# Patient Record
Sex: Female | Born: 1985 | Race: White | Hispanic: No | Marital: Single | Smoking: Never smoker
Health system: Southern US, Community
[De-identification: ages and names within clinical notes are randomized; demographics above are authoritative.]

## PROBLEM LIST (undated history)

## (undated) DIAGNOSIS — R569 Unspecified convulsions: Secondary | ICD-10-CM

## (undated) HISTORY — PX: OTHER SURGICAL HISTORY: SHX169

## (undated) HISTORY — PX: ADENOIDECTOMY: SUR15

## (undated) HISTORY — PX: TONSILLECTOMY: SUR1361

## (undated) HISTORY — PX: CHOLECYSTECTOMY: SHX55

---

## 2005-07-26 ENCOUNTER — Emergency Department (HOSPITAL_COMMUNITY): Admission: EM | Admit: 2005-07-26 | Discharge: 2005-07-26 | Payer: Self-pay | Admitting: Emergency Medicine

## 2006-09-09 ENCOUNTER — Emergency Department (HOSPITAL_COMMUNITY): Admission: EM | Admit: 2006-09-09 | Discharge: 2006-09-09 | Payer: Self-pay | Admitting: Emergency Medicine

## 2006-11-23 ENCOUNTER — Emergency Department (HOSPITAL_COMMUNITY): Admission: EM | Admit: 2006-11-23 | Discharge: 2006-11-23 | Payer: Self-pay | Admitting: Family Medicine

## 2007-05-26 ENCOUNTER — Emergency Department (HOSPITAL_COMMUNITY): Admission: EM | Admit: 2007-05-26 | Discharge: 2007-05-26 | Payer: Self-pay | Admitting: Emergency Medicine

## 2007-11-16 ENCOUNTER — Emergency Department (HOSPITAL_COMMUNITY): Admission: EM | Admit: 2007-11-16 | Discharge: 2007-11-16 | Payer: Self-pay | Admitting: Family Medicine

## 2008-12-05 ENCOUNTER — Ambulatory Visit (HOSPITAL_COMMUNITY): Admission: RE | Admit: 2008-12-05 | Discharge: 2008-12-05 | Payer: Self-pay | Admitting: Family Medicine

## 2009-10-02 ENCOUNTER — Ambulatory Visit (HOSPITAL_COMMUNITY): Admission: RE | Admit: 2009-10-02 | Discharge: 2009-10-02 | Payer: Self-pay | Admitting: Gastroenterology

## 2010-12-05 ENCOUNTER — Encounter: Payer: Self-pay | Admitting: Family Medicine

## 2011-01-09 ENCOUNTER — Inpatient Hospital Stay (INDEPENDENT_AMBULATORY_CARE_PROVIDER_SITE_OTHER)
Admission: RE | Admit: 2011-01-09 | Discharge: 2011-01-09 | Disposition: A | Discharge status: Home / Self Care | Payer: BC Managed Care – PPO | Source: Ambulatory Visit | Attending: Emergency Medicine | Admitting: Emergency Medicine

## 2011-01-09 DIAGNOSIS — R109 Unspecified abdominal pain: Secondary | ICD-10-CM

## 2011-01-09 DIAGNOSIS — N760 Acute vaginitis: Secondary | ICD-10-CM

## 2011-01-09 LAB — POCT URINALYSIS DIPSTICK
Nitrite: NEGATIVE
Urobilinogen, UA: 0.2 mg/dL (ref 0.0–1.0)
pH: 5.5 (ref 5.0–8.0)

## 2011-01-09 LAB — WET PREP, GENITAL: Trich, Wet Prep: NONE SEEN

## 2011-01-10 LAB — GC/CHLAMYDIA PROBE AMP, GENITAL: Chlamydia, DNA Probe: NEGATIVE

## 2011-01-12 ENCOUNTER — Emergency Department (HOSPITAL_COMMUNITY): Payer: BC Managed Care – PPO

## 2011-01-12 ENCOUNTER — Emergency Department (HOSPITAL_COMMUNITY)
Admission: EM | Admit: 2011-01-12 | Discharge: 2011-01-12 | Disposition: A | Discharge status: Home / Self Care | Payer: BC Managed Care – PPO | Attending: Emergency Medicine | Admitting: Emergency Medicine

## 2011-01-12 DIAGNOSIS — Z79899 Other long term (current) drug therapy: Secondary | ICD-10-CM | POA: Insufficient documentation

## 2011-01-12 DIAGNOSIS — N949 Unspecified condition associated with female genital organs and menstrual cycle: Secondary | ICD-10-CM | POA: Insufficient documentation

## 2011-01-12 DIAGNOSIS — N200 Calculus of kidney: Secondary | ICD-10-CM | POA: Insufficient documentation

## 2011-01-12 DIAGNOSIS — R109 Unspecified abdominal pain: Secondary | ICD-10-CM | POA: Insufficient documentation

## 2011-01-12 DIAGNOSIS — R10811 Right upper quadrant abdominal tenderness: Secondary | ICD-10-CM | POA: Insufficient documentation

## 2011-01-12 DIAGNOSIS — R112 Nausea with vomiting, unspecified: Secondary | ICD-10-CM | POA: Insufficient documentation

## 2011-01-12 LAB — DIFFERENTIAL
Basophils Absolute: 0 10*3/uL (ref 0.0–0.1)
Basophils Relative: 1 % (ref 0–1)
Lymphocytes Relative: 37 % (ref 12–46)
Neutro Abs: 2.7 10*3/uL (ref 1.7–7.7)
Neutrophils Relative %: 53 % (ref 43–77)

## 2011-01-12 LAB — CBC
HCT: 38.3 % (ref 36.0–46.0)
Hemoglobin: 12.4 g/dL (ref 12.0–15.0)
RBC: 4.55 MIL/uL (ref 3.87–5.11)
WBC: 5 10*3/uL (ref 4.0–10.5)

## 2011-01-12 LAB — COMPREHENSIVE METABOLIC PANEL
ALT: 15 U/L (ref 0–35)
AST: 19 U/L (ref 0–37)
Calcium: 8.9 mg/dL (ref 8.4–10.5)
Creatinine, Ser: 0.8 mg/dL (ref 0.4–1.2)
GFR calc Af Amer: >60 mL/min (ref >60)
GFR calc non Af Amer: >60 mL/min (ref >60)
Sodium: 138 mEq/L (ref 135–145)
Total Protein: 7 g/dL (ref 6.0–8.3)

## 2011-01-12 LAB — URINALYSIS, ROUTINE W REFLEX MICROSCOPIC
Bilirubin Urine: NEGATIVE
Nitrite: NEGATIVE
Specific Gravity, Urine: 1.011 (ref 1.005–1.030)
Urobilinogen, UA: 0.2 mg/dL (ref 0.0–1.0)
pH: 5.5 (ref 5.0–8.0)

## 2011-01-12 LAB — WET PREP, GENITAL
Clue Cells Wet Prep HPF POC: NONE SEEN
Trich, Wet Prep: NONE SEEN
Yeast Wet Prep HPF POC: NONE SEEN

## 2011-01-12 LAB — LIPASE, BLOOD: Lipase: 31 U/L (ref 11–59)

## 2011-01-12 LAB — URINE MICROSCOPIC-ADD ON

## 2011-01-12 LAB — POCT PREGNANCY, URINE: Preg Test, Ur: NEGATIVE

## 2011-01-13 LAB — GC/CHLAMYDIA PROBE AMP, GENITAL: Chlamydia, DNA Probe: NEGATIVE

## 2011-08-04 LAB — CBC
HCT: 38.3
Hemoglobin: 12.4
MCV: 82.2
Platelets: 264
RBC: 4.66
WBC: 5.7

## 2011-08-04 LAB — URINE CULTURE: Colony Count: 50000

## 2011-08-04 LAB — POCT URINALYSIS DIP (DEVICE)
Bilirubin Urine: NEGATIVE
Ketones, ur: NEGATIVE
Protein, ur: 30 — AB
pH: 6.5

## 2011-08-04 LAB — I-STAT 8, (EC8 V) (CONVERTED LAB)
BUN: 11
Chloride: 106
Glucose, Bld: 87
Hemoglobin: 14.6
Operator id: 200941
pCO2, Ven: 42.3 — ABNORMAL LOW

## 2011-08-04 LAB — DIFFERENTIAL
Eosinophils Absolute: 0.1
Eosinophils Relative: 1
Lymphocytes Relative: 26
Lymphs Abs: 1.5
Monocytes Absolute: 0.5
Monocytes Relative: 9

## 2011-08-04 LAB — POCT I-STAT CREATININE: Creatinine, Ser: 1

## 2011-08-04 LAB — POCT H PYLORI SCREEN: H. PYLORI SCREEN, POC: NEGATIVE

## 2011-09-13 ENCOUNTER — Emergency Department (HOSPITAL_COMMUNITY)
Admission: EM | Admit: 2011-09-13 | Discharge: 2011-09-13 | Disposition: A | Discharge status: Home / Self Care | Payer: BC Managed Care – PPO | Attending: Emergency Medicine | Admitting: Emergency Medicine

## 2011-09-13 ENCOUNTER — Emergency Department (HOSPITAL_COMMUNITY): Payer: BC Managed Care – PPO

## 2011-09-13 ENCOUNTER — Encounter (HOSPITAL_COMMUNITY): Payer: Self-pay | Admitting: Radiology

## 2011-09-13 DIAGNOSIS — R51 Headache: Secondary | ICD-10-CM | POA: Insufficient documentation

## 2011-09-13 DIAGNOSIS — S0280XA Fracture of other specified skull and facial bones, unspecified side, initial encounter for closed fracture: Secondary | ICD-10-CM | POA: Insufficient documentation

## 2011-09-13 DIAGNOSIS — Z8711 Personal history of peptic ulcer disease: Secondary | ICD-10-CM | POA: Insufficient documentation

## 2011-09-13 DIAGNOSIS — S0083XA Contusion of other part of head, initial encounter: Secondary | ICD-10-CM | POA: Insufficient documentation

## 2011-09-13 DIAGNOSIS — Z79899 Other long term (current) drug therapy: Secondary | ICD-10-CM | POA: Insufficient documentation

## 2011-09-13 DIAGNOSIS — S0003XA Contusion of scalp, initial encounter: Secondary | ICD-10-CM | POA: Insufficient documentation

## 2011-09-13 DIAGNOSIS — R296 Repeated falls: Secondary | ICD-10-CM | POA: Insufficient documentation

## 2011-09-13 DIAGNOSIS — S0990XA Unspecified injury of head, initial encounter: Secondary | ICD-10-CM | POA: Insufficient documentation

## 2011-09-13 LAB — GLUCOSE, CAPILLARY: Glucose-Capillary: 121 mg/dL — ABNORMAL HIGH (ref 70–99)

## 2011-09-29 ENCOUNTER — Other Ambulatory Visit: Payer: Self-pay | Admitting: Otolaryngology

## 2011-09-29 DIAGNOSIS — S020XXA Fracture of vault of skull, initial encounter for closed fracture: Secondary | ICD-10-CM

## 2011-09-29 DIAGNOSIS — J0110 Acute frontal sinusitis, unspecified: Secondary | ICD-10-CM

## 2011-10-05 ENCOUNTER — Other Ambulatory Visit: Payer: BC Managed Care – PPO

## 2011-10-11 ENCOUNTER — Emergency Department (HOSPITAL_COMMUNITY)
Admission: EM | Admit: 2011-10-11 | Discharge: 2011-10-11 | Disposition: A | Discharge status: Home / Self Care | Payer: BC Managed Care – PPO | Attending: Emergency Medicine | Admitting: Emergency Medicine

## 2011-10-11 ENCOUNTER — Encounter (HOSPITAL_COMMUNITY): Payer: Self-pay | Admitting: Emergency Medicine

## 2011-10-11 DIAGNOSIS — R569 Unspecified convulsions: Secondary | ICD-10-CM | POA: Insufficient documentation

## 2011-10-11 HISTORY — DX: Unspecified convulsions: R56.9

## 2011-10-11 LAB — CBC
HCT: 39.2 % (ref 36.0–46.0)
Hemoglobin: 12.8 g/dL (ref 12.0–15.0)
MCH: 27.7 pg (ref 26.0–34.0)
MCHC: 32.7 g/dL (ref 30.0–36.0)
MCV: 84.8 fL (ref 78.0–100.0)
RBC: 4.62 MIL/uL (ref 3.87–5.11)

## 2011-10-11 LAB — POCT I-STAT, CHEM 8
Calcium, Ion: 1.12 mmol/L (ref 1.12–1.32)
Creatinine, Ser: 0.8 mg/dL (ref 0.50–1.10)
Glucose, Bld: 113 mg/dL — ABNORMAL HIGH (ref 70–99)
HCT: 42 % (ref 36.0–46.0)
Hemoglobin: 14.3 g/dL (ref 12.0–15.0)

## 2011-10-11 LAB — DIFFERENTIAL
Basophils Relative: 1 % (ref 0–1)
Eosinophils Absolute: 0.1 10*3/uL (ref 0.0–0.7)
Eosinophils Relative: 2 % (ref 0–5)
Lymphs Abs: 2.9 10*3/uL (ref 0.7–4.0)
Monocytes Absolute: 0.6 10*3/uL (ref 0.1–1.0)
Monocytes Relative: 9 % (ref 3–12)

## 2011-10-11 MED ORDER — SODIUM CHLORIDE 0.9 % IV SOLN
500.0000 mg | Freq: Once | INTRAVENOUS | Status: AC
  Administered 2011-10-11: 500 mg via INTRAVENOUS
  Filled 2011-10-11: qty 5

## 2011-10-11 MED ORDER — ONDANSETRON HCL 4 MG/2ML IJ SOLN
4.0000 mg | Freq: Once | INTRAMUSCULAR | Status: AC
  Administered 2011-10-11: 4 mg via INTRAVENOUS
  Filled 2011-10-11: qty 2

## 2011-10-11 MED ORDER — LEVETIRACETAM 250 MG PO TABS: 250.0000 mg | ORAL_TABLET | Freq: Two times a day (BID) | ORAL | Status: DC

## 2011-10-11 MED ORDER — LEVETIRACETAM 250 MG PO TABS
250.0000 mg | ORAL_TABLET | Freq: Two times a day (BID) | ORAL | Status: DC
  Filled 2011-10-11 (×2): qty 1

## 2011-10-11 NOTE — ED Notes (Signed)
Sethi, MD at bedside. 

## 2011-10-11 NOTE — ED Notes (Signed)
Pt c/o nausea. Pt medicated as ordered, see MAR

## 2011-10-11 NOTE — ED Notes (Signed)
ZOX:[id number]<AV> Expected date:10/11/11<BR> Expected time: 3:55 PM<BR> Means of arrival:Ambulance<BR> Comments:<BR> EMS 11 GC, 24 yof seizure

## 2011-10-11 NOTE — ED Notes (Signed)
Pt to ER with possible seizure. Pt states that all she remembers is feeling very dizzy and waking up on the floor. Pt states that he peers informed her that she had a seizure. Pt denies hx of seizure. Pt in gown. Pt placed on monitor. Pt awaits res MD eval

## 2011-10-11 NOTE — ED Notes (Signed)
Patient is resting comfortably. 

## 2011-10-11 NOTE — ED Provider Notes (Signed)
History     CSN: [id number] Arrival date & time: 10/11/2011  4:13 PM   First MD Initiated Contact with Patient 10/11/11 1630      Chief Complaint  Patient presents with  . Seizures    (Consider location/radiation/quality/duration/timing/severity/associated sxs/prior treatment) HPI  Per patient over the past 2 years she's had about 3 episodes where she had seizure activity she states is from low blood sugar. She states she was told to see a neurologist but she hasn't done it. She states the most recent episode happened in a restaurant on October 27 when she was getting blurred vision and fell forward contusing her face. She was seen in the ER on 10:30 and had a head and maxillofacial CT done showing a superior orbital rim frontal sinus fracture on the right. She was referred to an ears nose and throat doctor that she cannot recall the name of that he is with [facility]. She relates she take breakfast today and had tea for lunch she was at school said she felt fine but her friend noted her eyes were dilated during the day and then she states she suddenly felt wobbly and had some blurred vision and saw stars and felt like she's to pass out and got out "someone catch me" for like 2 minutes. EMS report she was post ictal and had a blood sugar of 86. Patient denies any injury now. She relates she's had a glucose tolerance test done and has an appointment to see her primary care physician in 7 days.   Dr. Rolly Pancake primary care  Past Medical History  Diagnosis Date  . Seizures   . Attention deficit disorder     Past Surgical History  Procedure Date  . Cholecystectomy   . Tonsillectomy   . Adenoidectomy   . Xyphoid process removed     No family history on file.  History   Social History  . Marital Status: Single    Spouse Name: N/A    Number of Children: N/A  . Years of Education: N/A   Social History Main Topics  . Smoking status: None  . Smokeless tobacco: None  .  Alcohol Use: No  . Drug Use: No  . Sexually Active: Yes    Birth Control/ Protection: Pill    student  OB History    Grav Para Term Preterm Abortions TAB SAB Ect Mult Living                  Review of Systems  All other systems reviewed and are negative.    Allergies  Review of patient's allergies indicates no known allergies.  Home Medications   Current Outpatient Rx  Name Route Sig Dispense Refill  . AMPHETAMINE-DEXTROAMPHETAMINE 10 MG PO TABS Oral Take 10 mg by mouth daily.      . ASPIRIN 325 MG PO TABS Oral Take 325 mg by mouth daily.      Marland Kitchen LEVONORGESTREL-ETHINYL ESTRAD 0.1-20 MG-MCG PO TABS Oral Take 1 tablet by mouth daily.        BP 133/84  Pulse 91  Temp(Src) 98.2 F (36.8 C) (Oral)  Resp 18  SpO2 99%  LMP 08/18/2011  Vital signs normal  Physical Exam  Vitals reviewed. Constitutional: She is oriented to person, place, and time. She appears well-developed and well-nourished.  Non-toxic appearance. She does not appear ill. No distress.  HENT:  Head: Normocephalic and atraumatic.  Right Ear: External ear normal.  Left Ear: External ear normal.  Nose: Nose normal. No mucosal edema or rhinorrhea.  Mouth/Throat: Oropharynx is clear and moist and mucous membranes are normal. No dental abscesses or uvula swelling.       No trauma to the tongue  Eyes: Conjunctivae and EOM are normal. Pupils are equal, round, and reactive to light.  Neck: Normal range of motion and full passive range of motion without pain. Neck supple.  Cardiovascular: Normal rate, regular rhythm and normal heart sounds.  Exam reveals no gallop and no friction rub.   No murmur heard. Pulmonary/Chest: Effort normal and breath sounds normal. No respiratory distress. She has no wheezes. She has no rhonchi. She has no rales. She exhibits no tenderness and no crepitus.  Abdominal: Soft. Normal appearance and bowel sounds are normal. She exhibits no distension. There is no tenderness. There is no  rebound and no guarding.  Musculoskeletal: Normal range of motion. She exhibits no edema and no tenderness.       Moves all extremities well.   Neurological: She is alert and oriented to person, place, and time. She has normal strength. No cranial nerve deficit.  Skin: Skin is warm, dry and intact. No rash noted. No erythema. No pallor.  Psychiatric: She has a normal mood and affect. Her speech is normal and behavior is normal. Her mood appears not anxious.    ED Course  Procedures (including critical care time)  1932 I spoke to Dr. Pearlean Brownie [place] call he suggests giving patient [place] 500 mg IV then 250 mg twice a day he recommends outpatient EEG and followup in the office 2032 Dr Pearlean Brownie here in ED and is going to see patient.   Results for orders placed during the hospital encounter of 10/11/11  CBC      Component Value Range   WBC 6.5  4.0 - 10.5 (K/uL)   RBC 4.62  3.87 - 5.11 (MIL/uL)   Hemoglobin 12.8  12.0 - 15.0 (g/dL)   HCT 16.1  09.6 - 04.5 (%)   MCV 84.8  78.0 - 100.0 (fL)   MCH 27.7  26.0 - 34.0 (pg)   MCHC 32.7  30.0 - 36.0 (g/dL)   RDW 40.9  81.1 - 91.4 (%)   Platelets 310  150 - 400 (K/uL)  DIFFERENTIAL      Component Value Range   Neutrophils Relative 45  43 - 77 (%)   Neutro Abs 2.9  1.7 - 7.7 (K/uL)   Lymphocytes Relative 44  12 - 46 (%)   Lymphs Abs 2.9  0.7 - 4.0 (K/uL)   Monocytes Relative 9  3 - 12 (%)   Monocytes Absolute 0.6  0.1 - 1.0 (K/uL)   Eosinophils Relative 2  0 - 5 (%)   Eosinophils Absolute 0.1  0.0 - 0.7 (K/uL)   Basophils Relative 1  0 - 1 (%)   Basophils Absolute 0.0  0.0 - 0.1 (K/uL)  POCT I-STAT, CHEM 8      Component Value Range   Sodium 141  135 - 145 (mEq/L)   Potassium 3.7  3.5 - 5.1 (mEq/L)   Chloride 106  96 - 112 (mEq/L)   BUN 12  6 - 23 (mg/dL)   Creatinine, Ser 7.82  0.50 - 1.10 (mg/dL)   Glucose, Bld 956 (*) 70 - 99 (mg/dL)   Calcium, Ion 2.13  0.86 - 1.32 (mmol/L)   TCO2 22  0 - 100 (mmol/L)   Hemoglobin 14.3  12.0 - 15.0  (g/dL)   HCT 57.8  46.9 -  46.0 (%)   Laboratory interpretation normal  Ct Head Wo Contrast  09/13/2011  *RADIOLOGY REPORT*  Clinical Data:  Fall.  Loss of consciousness.  Headache and nausea and vomiting.  CT HEAD WITHOUT CONTRAST CT MAXILLOFACIAL WITHOUT CONTRAST  Technique:  Multidetector CT imaging of the head and maxillofacial structures were performed using the standard protocol without intravenous contrast. Multiplanar CT image reconstructions of the maxillofacial structures were also generated.  Comparison:  None  CT HEAD  Findings: The brain stem, cerebellum, cerebral peduncles, thalami, basal ganglia, basilar cisterns, and ventricular system appear unremarkable.  No intracranial hemorrhage, mass lesion, or acute infarction is identified.  Right forehead scalp hematoma noted.  There is opacification the right frontal sinus.  This will be further investigated on the dedicated facial CT.  IMPRESSION:  1.  Right forehead scalp hematoma.  There is also opacification the right frontal sinus. 2.  Otherwise no significant findings.  CT MAXILLOFACIAL  Findings:   A linear lucency along the superior orbital rim extends into the right frontal sinus which is opacified.  Although the linear lucency resembles a vessel or suture line, the opacification of the right frontal sinus and adjacent scalp soft tissue swelling make this finding concerning for a small fracture extending into the frontal sinus and superior orbit.  The zygomatic arches, mandible, maxilla, and nasal bones appear intact, as do the pterygoid plates. Right frontal scalp hematoma noted along the forehead.  No gas is observed in the orbit or in the cranial vault.  IMPRESSION:  1.  Subtle linear bony irregularity along the medial portion of the superior orbital rim extends into the frontal sinus and there is opacification of the right frontal sinus.  Although no significant intraorbital findings are observed, the appearance and the adjacent to scalp  soft tissue swelling raise suspicion for a subtle nondisplaced fracture of the right medial superior orbital rim involving the right frontal sinus.  Original Report Authenticated By: Dellia Cloud, M.D.   Ct Maxillofacial Wo Cm  09/13/2011  *RADIOLOGY REPORT*  Clinical Data:  Fall.  Loss of consciousness.  Headache and nausea and vomiting.  CT HEAD WITHOUT CONTRAST CT MAXILLOFACIAL WITHOUT CONTRAST  Technique:  Multidetector CT imaging of the head and maxillofacial structures were performed using the standard protocol without intravenous contrast. Multiplanar CT image reconstructions of the maxillofacial structures were also generated.  Comparison:  None  CT HEAD  Findings: The brain stem, cerebellum, cerebral peduncles, thalami, basal ganglia, basilar cisterns, and ventricular system appear unremarkable.  No intracranial hemorrhage, mass lesion, or acute infarction is identified.  Right forehead scalp hematoma noted.  There is opacification the right frontal sinus.  This will be further investigated on the dedicated facial CT.  IMPRESSION:  1.  Right forehead scalp hematoma.  There is also opacification the right frontal sinus. 2.  Otherwise no significant findings.  CT MAXILLOFACIAL  Findings:   A linear lucency along the superior orbital rim extends into the right frontal sinus which is opacified.  Although the linear lucency resembles a vessel or suture line, the opacification of the right frontal sinus and adjacent scalp soft tissue swelling make this finding concerning for a small fracture extending into the frontal sinus and superior orbit.  The zygomatic arches, mandible, maxilla, and nasal bones appear intact, as do the pterygoid plates. Right frontal scalp hematoma noted along the forehead.  No gas is observed in the orbit or in the cranial vault.  IMPRESSION:  1.  Subtle linear bony irregularity  along the medial portion of the superior orbital rim extends into the frontal sinus and there is  opacification of the right frontal sinus.  Although no significant intraorbital findings are observed, the appearance and the adjacent to scalp soft tissue swelling raise suspicion for a subtle nondisplaced fracture of the right medial superior orbital rim involving the right frontal sinus.  Original Report Authenticated By: Dellia Cloud, M.D.      Diagnoses that have been ruled out:  Diagnoses that are still under consideration:  Final diagnoses:  Seizure   Plan discharge Devoria Albe, MD, Armando Gang   MDM          Ward Givens, MD 10/11/11 2246

## 2011-10-11 NOTE — ED Notes (Signed)
Witnessed seizure while at school and has history. Alert and oriented at present. No incontinence. Was postictal on EMS arrival.

## 2011-10-11 NOTE — Consult Note (Signed)
Reason for Consult:seizure Referring Physician: Ahmiyah White is an 25 y.o. female.  HPI: [name] presented with an episode of brief loss of consciousness. She states she felt dizzy lightheaded and feeling of about to pass out. She was able to hold onto a friend but the friend describes that she fell down and had brief jerking movements of her extremities.Friend is not available at the bedside to corroborate rthe story. EMS arrived and found the patient to be confused and postictal for a brief period before  she gradually improved . She is complaining of mild headache but feels otherwise she is back to normal. She had a somewhat similar episode a month ago when she actually fell and hit her head and had a minor orbital fracture. She was seen in ER and head CT was    normal  [place]  . Her blood glucose was found to be low at 46 mg percent at that time. She states she's had 2 other similar episodes couple of years ago which were also related to some lack of sleep and in 1 of these  episodes her sugar was low but in the other episode it was normal.Also her sugar was found to be 86 mg percent today. She does admit to not having slept for more than  3-4 hours last night. She is a Theatre stage manager at [employer]. She denies significant stress. She has no history of childhood's seizures, febrile seizures or family history of epilepsy. She denies any known prior neurologic problems.she denies history of alcohol, cocaine or other street drugs  Past Medical History  Diagnosis Date  . Seizures   . Attention deficit disorder     Past Surgical History  Procedure Date  . Cholecystectomy   . Tonsillectomy   . Adenoidectomy   . Xyphoid process removed     No family history on file.  Social History:  does not have a smoking history on file. She does not have any smokeless tobacco history on file. She reports that she does not drink alcohol or use illicit drugs.  Allergies: No Known  Allergies  Medications: I have reviewed the patient's current medications.  Results for orders placed during the hospital encounter of 10/11/11 (from the past 48 hour(s))  CBC     Status: Normal   Collection Time   10/11/11  4:46 PM      Component Value Range Comment   WBC 6.5  4.0 - 10.5 (K/uL)    RBC 4.62  3.87 - 5.11 (MIL/uL)    Hemoglobin 12.8  12.0 - 15.0 (g/dL)    HCT 40.9  81.1 - 91.4 (%)    MCV 84.8  78.0 - 100.0 (fL)    MCH 27.7  26.0 - 34.0 (pg)    MCHC 32.7  30.0 - 36.0 (g/dL)    RDW 78.2  95.6 - 21.3 (%)    Platelets 310  150 - 400 (K/uL)   DIFFERENTIAL     Status: Normal   Collection Time   10/11/11  4:46 PM      Component Value Range Comment   Neutrophils Relative 45  43 - 77 (%)    Neutro Abs 2.9  1.7 - 7.7 (K/uL)    Lymphocytes Relative 44  12 - 46 (%)    Lymphs Abs 2.9  0.7 - 4.0 (K/uL)    Monocytes Relative 9  3 - 12 (%)    Monocytes Absolute 0.6  0.1 - 1.0 (K/uL)  Eosinophils Relative 2  0 - 5 (%)    Eosinophils Absolute 0.1  0.0 - 0.7 (K/uL)    Basophils Relative 1  0 - 1 (%)    Basophils Absolute 0.0  0.0 - 0.1 (K/uL)   POCT I-STAT, CHEM 8     Status: Abnormal   Collection Time   10/11/11  4:59 PM      Component Value Range Comment   Sodium 141  135 - 145 (mEq/L)    Potassium 3.7  3.5 - 5.1 (mEq/L)    Chloride 106  96 - 112 (mEq/L)    BUN 12  6 - 23 (mg/dL)    Creatinine, Ser 1.61  0.50 - 1.10 (mg/dL)    Glucose, Bld 096 (*) 70 - 99 (mg/dL)    Calcium, Ion 0.45  1.12 - 1.32 (mmol/L)    TCO2 22  0 - 100 (mmol/L)    Hemoglobin 14.3  12.0 - 15.0 (g/dL)    HCT 40.9  81.1 - 91.4 (%)     No results found.   ROS as documented in HPI Blood pressure 124/70, pulse 77, temperature 98.6 F (37 C), temperature source Oral, resp. rate 16, last menstrual period 08/18/2011, SpO2 100.00%.  Physical exam pleasant young Caucasian lady currently not in distress. Afebrile. Head is not dramatic neck is supple without bruit. Hearing is normal. Cardiac exam gallop.  Lungs clear to auscultation. Abdomen soft nontender.  Neurological exam Awake  Alert oriented x 3. Normal speech and language.eye movements full without nystagmus. Face symmetric. Tongue midline. Normal strength, tone, reflexes and coordination. Normal sensation. Gait deferred.   Assessment  25 year old occasional lady with recurrent episodes of brief loss of consciousness followed by transient confusion possible seizures though some of these episodes have been associated with hypoglycemia.  Plan I had a long discussion with the patient and her brother regarding these episodes, the differential diagnosis and answered questions. I would recommend a trial of anticonvulsants given the fact that she has had multiple episodes and has hurt herself and the prolonged post ictal phase would argue in favor of seizures rather than syncope. Start Keppra 500 mg IV x1 now followed by 250 mg twice daily orally. Check MRI scan of the brain with and without contrast and EEG as an outpatient. I have instructed her not to drive for the next 6 months as mandated by [place] law.. Have also advised her not to get pregnant and to use contraception while she is on Keppra. Follow up with me as an outpatient call for questions.    Bethany White,Bethany White 10/11/2011, 8:47 PM

## 2013-05-05 IMAGING — CT CT MAXILLOFACIAL W/O CM
3 of 4 series · 15 of 47 positions shown, 18 images · non-contrast
Comparison: None

CT HEAD

CLINICAL DATA: Fall.  Loss of consciousness.  Headache and nausea
and vomiting.

CT HEAD WITHOUT CONTRAST
CT MAXILLOFACIAL WITHOUT CONTRAST
TECHNIQUE: Multidetector CT imaging of the head and maxillofacial
structures were performed using the standard protocol without
intravenous contrast. Multiplanar CT image reconstructions of the
maxillofacial structures were also generated.

[Series 6: facial 2.0 h30s st · axial · 0.35mm/px · z∈[-94,+26]mm · 9 of 72 slices shown, 12 images]
[im 8/72  brain]
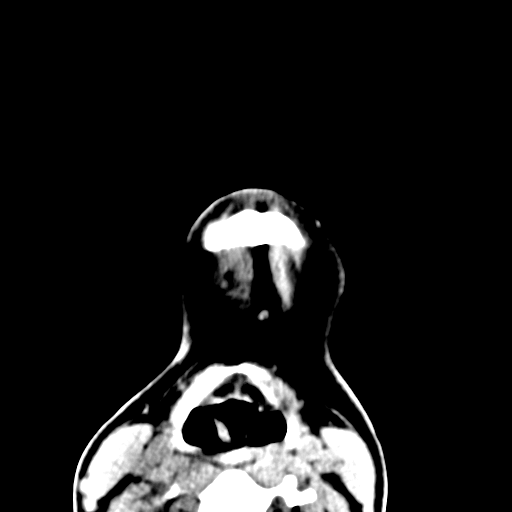
[im 8/72  bone]
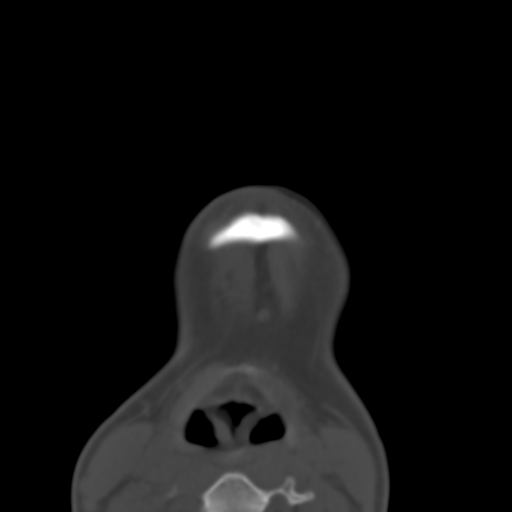
[im 15/72  bone]
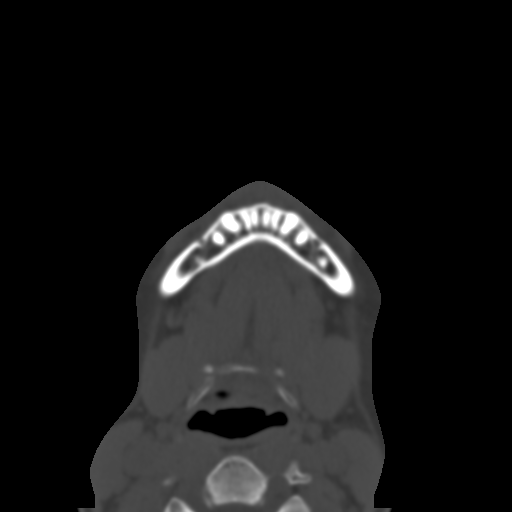
[im 23/72  bone]
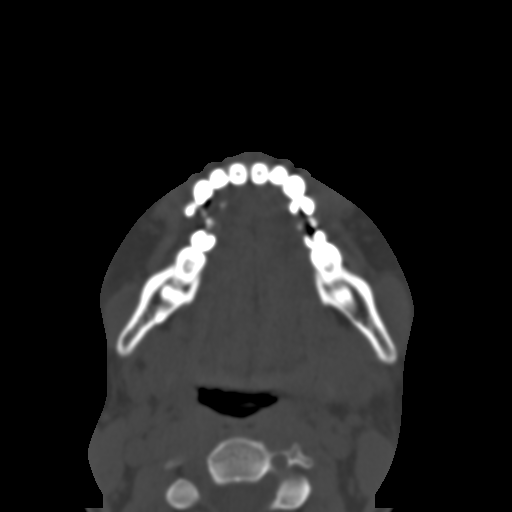
[im 30/72  bone]
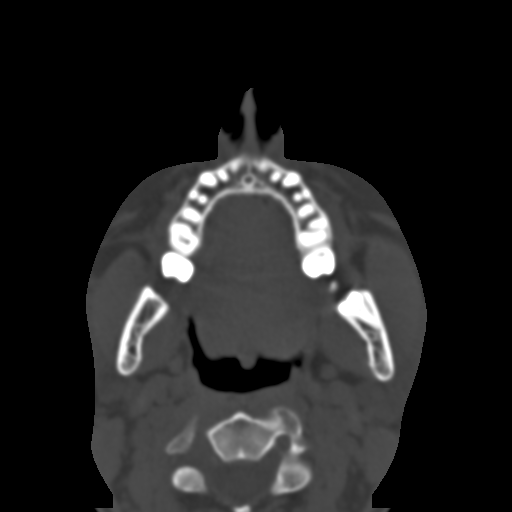
[im 38/72  brain]
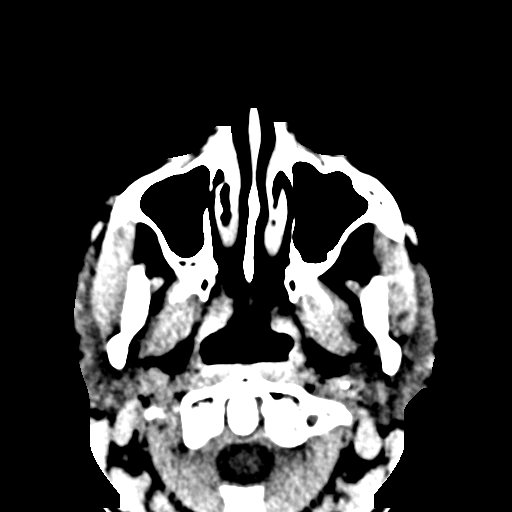
[im 38/72  bone]
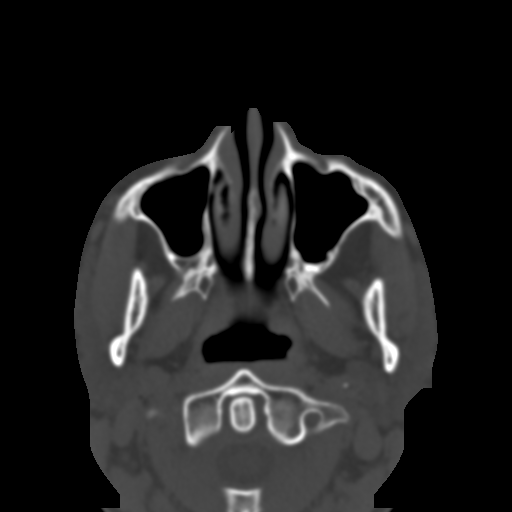
[im 45/72  bone]
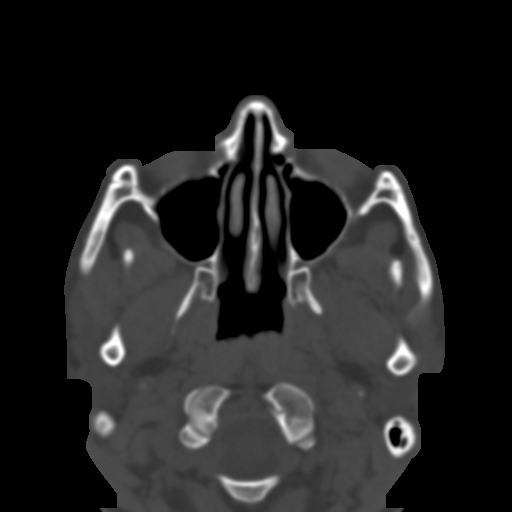
[im 53/72  bone]
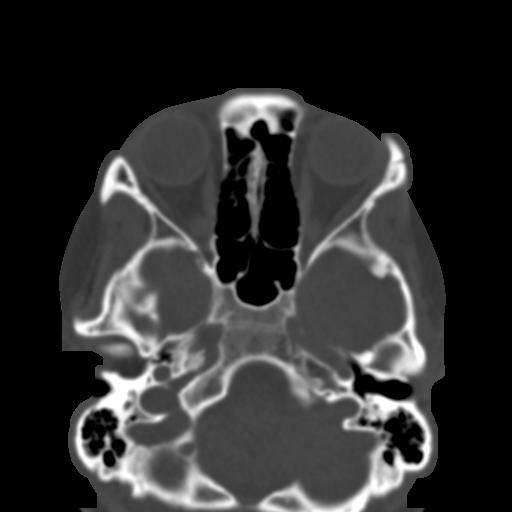
[im 60/72  bone]
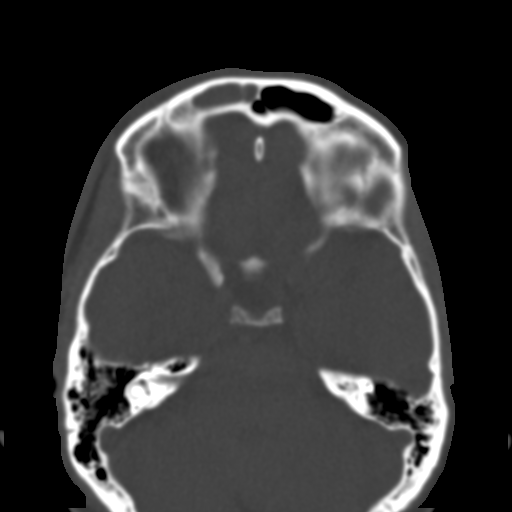
[im 68/72  brain]
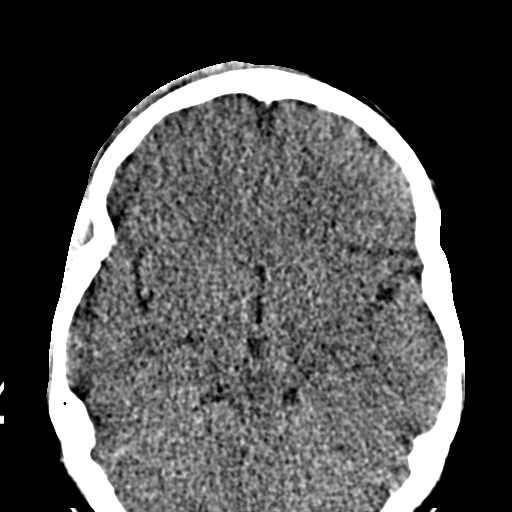
[im 68/72  bone]
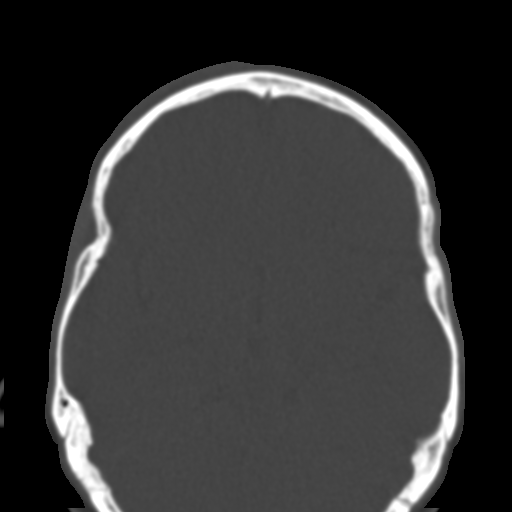

[Series 604: st cor · coronal · 0.35mm/px · 3 of 75 slices shown]
[im 25/75  bone]
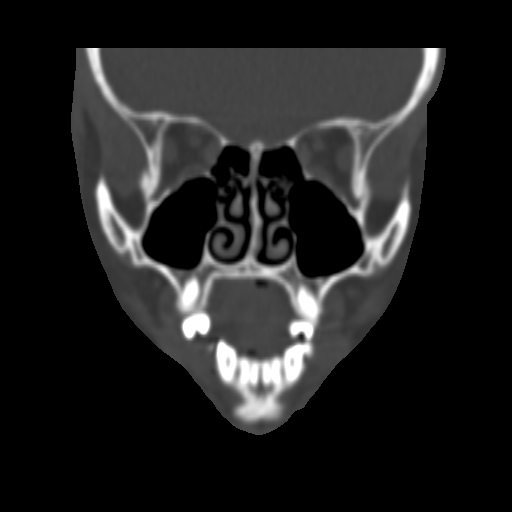
[im 33/75  bone]
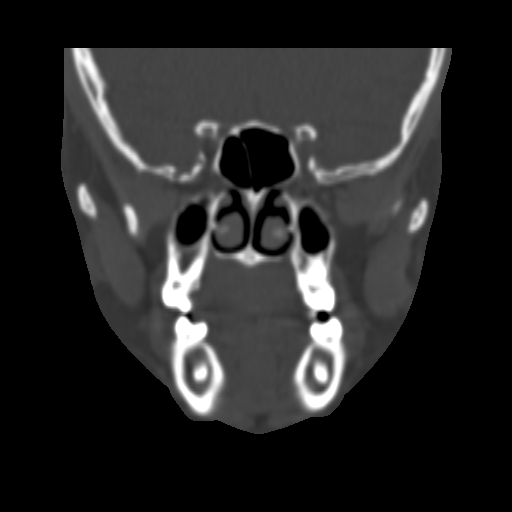
[im 42/75  bone]
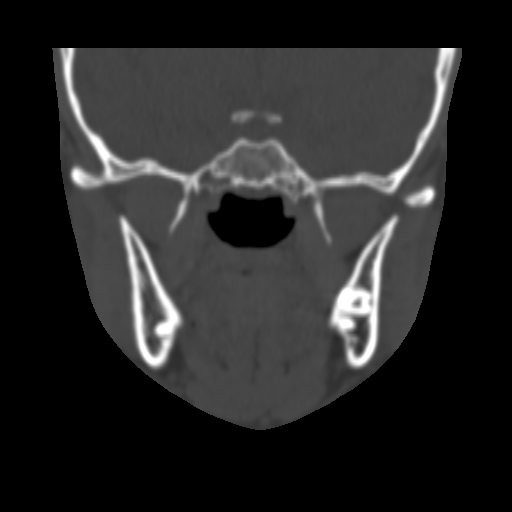

[Series 605: st sag · sagittal · 0.35mm/px · 3 of 74 slices shown]
[im 25/74  bone]
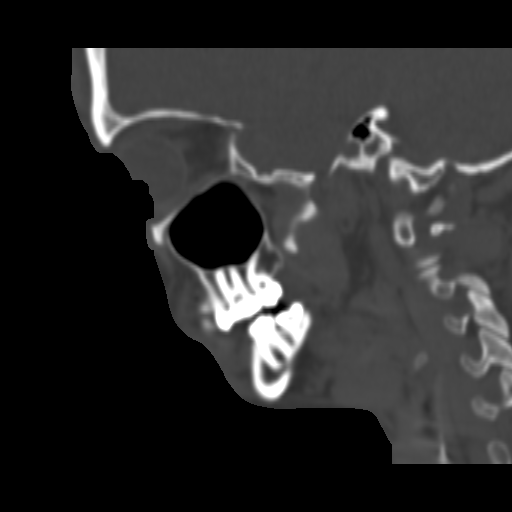
[im 37/74  bone]
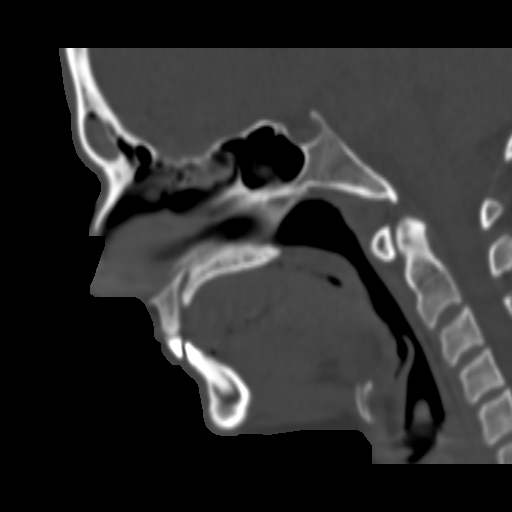
[im 49/74  bone]
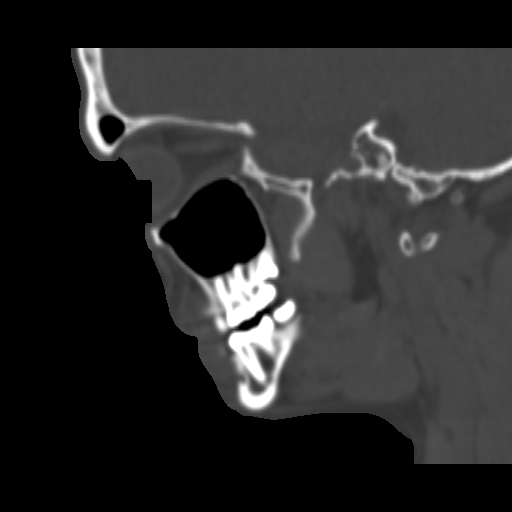

[15 of 47 positions shown; findings below may reference images not displayed]

FINDINGS: The brain stem, cerebellum, cerebral peduncles, thalami,
basal ganglia, basilar cisterns, and ventricular system appear
unremarkable.

No intracranial hemorrhage, mass lesion, or acute infarction is
identified.

Right forehead scalp hematoma noted.

There is opacification the right frontal sinus.  This will be
further investigated on the dedicated facial CT.
IMPRESSION: 1.  Right forehead scalp hematoma.  There is also opacification the
right frontal sinus.
2.  Otherwise no significant findings.

CT MAXILLOFACIAL
FINDINGS: A linear lucency along the superior orbital rim extends
into the right frontal sinus which is opacified.  Although the
linear lucency resembles a vessel or suture line, the opacification
of the right frontal sinus and adjacent scalp soft tissue swelling
make this finding concerning for a small fracture extending into
the frontal sinus and superior orbit.

The zygomatic arches, mandible, maxilla, and nasal bones appear
intact, as do the pterygoid plates. Right frontal scalp hematoma
noted along the forehead.  No gas is observed in the orbit or in
the cranial vault.
IMPRESSION: 1.  Subtle linear bony irregularity along the medial portion of the
superior orbital rim extends into the frontal sinus and there is
opacification of the right frontal sinus.  Although no significant
intraorbital findings are observed, the appearance and the adjacent
to scalp soft tissue swelling raise suspicion for a subtle
nondisplaced fracture of the right medial superior orbital rim
involving the right frontal sinus.

## 2014-10-08 ENCOUNTER — Encounter (HOSPITAL_COMMUNITY): Payer: Self-pay | Admitting: Cardiology

## 2014-10-08 ENCOUNTER — Emergency Department (HOSPITAL_COMMUNITY)
Admission: EM | Admit: 2014-10-08 | Discharge: 2014-10-08 | Disposition: A | Discharge status: Home / Self Care | Payer: BC Managed Care – PPO | Attending: Emergency Medicine | Admitting: Emergency Medicine

## 2014-10-08 ENCOUNTER — Emergency Department (HOSPITAL_COMMUNITY): Payer: BC Managed Care – PPO

## 2014-10-08 DIAGNOSIS — N210 Calculus in bladder: Secondary | ICD-10-CM | POA: Insufficient documentation

## 2014-10-08 DIAGNOSIS — R339 Retention of urine, unspecified: Secondary | ICD-10-CM | POA: Diagnosis not present

## 2014-10-08 DIAGNOSIS — Z3202 Encounter for pregnancy test, result negative: Secondary | ICD-10-CM | POA: Diagnosis not present

## 2014-10-08 DIAGNOSIS — Z79899 Other long term (current) drug therapy: Secondary | ICD-10-CM | POA: Diagnosis not present

## 2014-10-08 DIAGNOSIS — G40909 Epilepsy, unspecified, not intractable, without status epilepticus: Secondary | ICD-10-CM | POA: Insufficient documentation

## 2014-10-08 DIAGNOSIS — R109 Unspecified abdominal pain: Secondary | ICD-10-CM

## 2014-10-08 DIAGNOSIS — Z9049 Acquired absence of other specified parts of digestive tract: Secondary | ICD-10-CM | POA: Insufficient documentation

## 2014-10-08 DIAGNOSIS — Z8739 Personal history of other diseases of the musculoskeletal system and connective tissue: Secondary | ICD-10-CM | POA: Insufficient documentation

## 2014-10-08 DIAGNOSIS — F909 Attention-deficit hyperactivity disorder, unspecified type: Secondary | ICD-10-CM | POA: Insufficient documentation

## 2014-10-08 LAB — BASIC METABOLIC PANEL
ANION GAP: 9 (ref 5–15)
BUN: 11 mg/dL (ref 6–23)
CALCIUM: 8.8 mg/dL (ref 8.4–10.5)
CHLORIDE: 102 meq/L (ref 96–112)
CO2: 29 mEq/L (ref 19–32)
CREATININE: 0.95 mg/dL (ref 0.50–1.10)
GFR calc non Af Amer: 81 mL/min — ABNORMAL LOW (ref >90)
Glucose, Bld: 87 mg/dL (ref 70–99)
Potassium: 4.1 mEq/L (ref 3.7–5.3)
Sodium: 140 mEq/L (ref 137–147)

## 2014-10-08 LAB — CBC WITH DIFFERENTIAL/PLATELET
BASOS ABS: 0 10*3/uL (ref 0.0–0.1)
BASOS PCT: 1 % (ref 0–1)
Eosinophils Absolute: 0.1 10*3/uL (ref 0.0–0.7)
Eosinophils Relative: 2 % (ref 0–5)
HCT: 39 % (ref 36.0–46.0)
HEMOGLOBIN: 12.7 g/dL (ref 12.0–15.0)
Lymphocytes Relative: 49 % — ABNORMAL HIGH (ref 12–46)
Lymphs Abs: 2.1 10*3/uL (ref 0.7–4.0)
MCH: 28.1 pg (ref 26.0–34.0)
MCHC: 32.6 g/dL (ref 30.0–36.0)
MCV: 86.3 fL (ref 78.0–100.0)
Monocytes Absolute: 0.5 10*3/uL (ref 0.1–1.0)
Monocytes Relative: 12 % (ref 3–12)
NEUTROS ABS: 1.6 10*3/uL — AB (ref 1.7–7.7)
NEUTROS PCT: 36 % — AB (ref 43–77)
PLATELETS: 216 10*3/uL (ref 150–400)
RBC: 4.52 MIL/uL (ref 3.87–5.11)
RDW: 13.1 % (ref 11.5–15.5)
WBC: 4.4 10*3/uL (ref 4.0–10.5)

## 2014-10-08 LAB — URINE MICROSCOPIC-ADD ON

## 2014-10-08 LAB — URINALYSIS, ROUTINE W REFLEX MICROSCOPIC
Bilirubin Urine: NEGATIVE
Glucose, UA: NEGATIVE mg/dL
KETONES UR: NEGATIVE mg/dL
LEUKOCYTES UA: NEGATIVE
NITRITE: NEGATIVE
PH: 6 (ref 5.0–8.0)
Protein, ur: NEGATIVE mg/dL
SPECIFIC GRAVITY, URINE: 1.025 (ref 1.005–1.030)
Urobilinogen, UA: 0.2 mg/dL (ref 0.0–1.0)

## 2014-10-08 LAB — PREGNANCY, URINE: PREG TEST UR: NEGATIVE

## 2014-10-08 MED ORDER — KETOROLAC TROMETHAMINE 30 MG/ML IJ SOLN
30.0000 mg | Freq: Once | INTRAMUSCULAR | Status: AC
  Administered 2014-10-08: 30 mg via INTRAVENOUS
  Filled 2014-10-08: qty 1

## 2014-10-08 MED ORDER — CYCLOBENZAPRINE HCL 10 MG PO TABS
10.0000 mg | ORAL_TABLET | Freq: Three times a day (TID) | ORAL | Status: AC | PRN
Start: 2014-10-08 — End: ?

## 2014-10-08 MED ORDER — SODIUM CHLORIDE 0.9 % IV SOLN
INTRAVENOUS | Status: DC
  Administered 2014-10-08: 1000 mL via INTRAVENOUS

## 2014-10-08 MED ORDER — IOHEXOL 300 MG/ML  SOLN
100.0000 mL | Freq: Once | INTRAMUSCULAR | Status: AC | PRN
  Administered 2014-10-08: 100 mL via INTRAVENOUS

## 2014-10-08 MED ORDER — MORPHINE SULFATE 4 MG/ML IJ SOLN
4.0000 mg | Freq: Once | INTRAMUSCULAR | Status: AC
  Administered 2014-10-08: 4 mg via INTRAVENOUS
  Filled 2014-10-08: qty 1

## 2014-10-08 MED ORDER — NAPROXEN 500 MG PO TABS: 500.0000 mg | ORAL_TABLET | Freq: Two times a day (BID) | ORAL | Status: DC

## 2014-10-08 MED ORDER — CYCLOBENZAPRINE HCL 10 MG PO TABS
10.0000 mg | ORAL_TABLET | Freq: Once | ORAL | Status: AC
  Administered 2014-10-08: 10 mg via ORAL
  Filled 2014-10-08: qty 1

## 2014-10-08 MED ORDER — SODIUM CHLORIDE 0.9 % IV BOLUS (SEPSIS): 1000.0000 mL | Freq: Once | INTRAVENOUS | Status: DC

## 2014-10-08 MED ORDER — FENTANYL CITRATE 0.05 MG/ML IJ SOLN
50.0000 ug | Freq: Once | INTRAMUSCULAR | Status: AC
  Administered 2014-10-08: 50 ug via INTRAVENOUS
  Filled 2014-10-08: qty 2

## 2014-10-08 MED ORDER — ONDANSETRON HCL 4 MG/2ML IJ SOLN
4.0000 mg | Freq: Once | INTRAMUSCULAR | Status: AC
  Administered 2014-10-08: 4 mg via INTRAVENOUS
  Filled 2014-10-08: qty 2

## 2014-10-08 NOTE — ED Notes (Signed)
Discharge instructions given, pt demonstrated teach back and verbal understanding of leg bag usage and how to empty. Follow instructions and care discussed and pt gave verbal understanding.

## 2014-10-08 NOTE — Discharge Instructions (Signed)
You have plenty of pain medication at home you can take for your pain. You can add the flexeril and naproxen to your pain regimen. Drink plenty of fluids to keep your urine clear. Call [facility] to be seen in the office to see why you are having difficulty urinating. Recheck if the tube stops draining urine.

## 2014-10-08 NOTE — ED Provider Notes (Signed)
CSN: [id number]     Arrival date & time 10/08/14  1606 History   This chart was scribed for Ward Givens, MD by Annye Asa, ED Scribe. This patient was seen in room APA08/APA08 and the patient's care was started at 4:43 PM.    Chief Complaint  Patient presents with  . Flank Pain  . Dysuria   Patient is a 28 y.o. female presenting with dysuria. The history is provided by the patient.  Dysuria Associated symptoms: nausea      HPI Comments: Bethany White is a 28 y.o. female with past medical history of kidney stones (4 lifetime, beginning at 28y/o) who presents to the Emergency Department complaining of flank pain and dysuria. Patient reports that she passed a kidney stone 3 days PTA with pain in her lower right side. She returned to work the next day with pain in her lower back and right side and could not pass urine; she was actively straining but unable to void. This pain has continued throughout the night last night and today; her pain is now located in her groin and radiating to her back with the same difficulty urinating. She reports nausea. She denies SOB, fever or vomiting. She has attempted to manage her pain with ice and pain meds (10mg  oxycontin, last dose last night). Nothing makes symptoms better; her pain worsens after straining to urinate and lying back down. Her pain does not worsen with movement (twisting, bending, etc).   She reports a family history of kidney stones (maternal). She is a "big caffeine drinker" (estimated 5 glasses of caffeinated sweet tea per day, 4 Pepsis a day).   Her PCP is Dr. Anselm Jungling in [place]. Patient takes 10mg  of Adderall as needed (not daily). 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 She reports that she does not take this medication every day ("only when it rains," or an estimated 6x in the past two weeks). She is not on birth control at this  time. She does not take Keppra (taken off by her neurologist after having 2 seizures several years ago) or aspirin. She has a surgical history of cholecystectomy and xyphoid process removal.   PCP Dr Salvadore Farber, [facility]er.  Past Medical History  Diagnosis Date  . Seizures   . Attention deficit disorder    Past Surgical History  Procedure Laterality Date  . Cholecystectomy    . Tonsillectomy    . Adenoidectomy    . Xyphoid process removed     History reviewed. No pertinent family history. History  Substance Use Topics  . Smoking status: Never Smoker   . Smokeless tobacco: Not on file  . Alcohol Use: No   employed  OB History    No data available     Review of Systems  Gastrointestinal: Positive for nausea and abdominal distention.  Genitourinary: Positive for dysuria and difficulty urinating.  All other systems reviewed and are negative.   Allergies  Lamictal  Home Medications   Prior to Admission medications   Medication Sig Start Date End Date Taking? Authorizing Provider  amphetamine-dextroamphetamine (ADDERALL) 20 MG tablet Take 10 mg by mouth every morning.  10/01/14  Yes Historical Provider, MD  OxyCODONE (OXYCONTIN) 10 mg T12A 12 hr tablet Take 10 mg by mouth every 12 (twelve) hours as needed (for pain).  10/31/14 11/30/14 Yes Historical Provider, MD  levETIRAcetam (KEPPRA) 250 MG tablet Take 1  tablet (250 mg total) by mouth every 12 (twelve) hours. Patient not taking: Reported on 10/08/2014 10/11/11 10/10/12  Ward Givens, MD   BP 130/85 mmHg  Pulse 98  Temp(Src) 98.5 F (36.9 C) (Oral)  Resp 20  Ht 5\' 4"  (1.626 m)  Wt 118 lb (53.524 kg)  BMI 20.24 kg/m2  SpO2 100%  LMP 09/17/2014  Vital signs normal   Physical Exam  Constitutional: She is oriented to person, place, and time. She appears well-developed and well-nourished.  Non-toxic appearance. She does not appear ill. She appears distressed.  Laying on her right side with husband holding ice on her  flank  HENT:  Head: Normocephalic and atraumatic.  Right Ear: External ear normal.  Left Ear: External ear normal.  Nose: Nose normal. No mucosal edema or rhinorrhea.  Mouth/Throat: Oropharynx is clear and moist and mucous membranes are normal. No dental abscesses or uvula swelling.  Eyes: Conjunctivae and EOM are normal. Pupils are equal, round, and reactive to light.  Neck: Normal range of motion and full passive range of motion without pain. Neck supple.  Cardiovascular: Normal rate, regular rhythm and normal heart sounds.  Exam reveals no gallop and no friction rub.   No murmur heard. Pulmonary/Chest: Effort normal and breath sounds normal. No respiratory distress. She has no wheezes. She has no rhonchi. She has no rales. She exhibits no tenderness and no crepitus.  Abdominal: Soft. Normal appearance and bowel sounds are normal. She exhibits distension. There is no tenderness. There is no rebound and no guarding.  Mild distension over lower abdomen  Genitourinary:  Right CVA tenderness  Musculoskeletal: Normal range of motion. She exhibits no edema or tenderness.  Moves all extremities well.   Neurological: She is alert and oriented to person, place, and time. She has normal strength. No cranial nerve deficit.  Skin: Skin is warm, dry and intact. No rash noted. No erythema. No pallor.  Psychiatric: She has a normal mood and affect. Her speech is normal and behavior is normal. Her mood appears not anxious.  Nursing note and vitals reviewed.   ED Course  Procedures  Medications  0.9 %  sodium chloride infusion ( Intravenous Stopped 10/08/14 1951)  sodium chloride 0.9 % bolus 1,000 mL (0 mLs Intravenous Stopped 10/08/14 1951)  cyclobenzaprine (FLEXERIL) tablet 10 mg (not administered)  morphine 4 MG/ML injection 4 mg (4 mg Intravenous Given 10/08/14 1708)  ondansetron (ZOFRAN) injection 4 mg (4 mg Intravenous Given 10/08/14 1708)  morphine 4 MG/ML injection 4 mg (4 mg Intravenous  Given 10/08/14 1828)  ketorolac (TORADOL) 30 MG/ML injection 30 mg (30 mg Intravenous Given 10/08/14 1841)  iohexol (OMNIPAQUE) 300 MG/ML solution 100 mL (100 mLs Intravenous Contrast Given 10/08/14 1941)  fentaNYL (SUBLIMAZE) injection 50 mcg (50 mcg Intravenous Given 10/08/14 2005)     DIAGNOSTIC STUDIES: Oxygen Saturation is 100% on RA, normal by my interpretation.    COORDINATION OF CARE: 4:53 PM Discussed treatment plan with pt at bedside and pt agreed to plan.  Bladder scan was done by nursing staff and showed 290 mL of urine. In and out cath was done for 300 mL of clear urine.  7:19 PM Discussed CT results; patient verbalized understanding. She agreed to utilize a catheter at home for the time-being. We will get a CT with contrast to make sure she does not have a renal infarction.  8:45 PM Discussed discharge with anti-nausea medication and a potential referral to a urologist.  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 She states she has only taken 6 of her oxycodone that were prescribed 1 week ago. At this point I do not have an explanation for her flank pain.   Review of the [place] shows patient has no prescriptions filled under the name of Bethany White. She does however have multiple prescriptions under the name of Bethany White which is her maiden name. Patient has received 53 prescriptions for controlled medications since 11/14/2013. They are for  alprazolam,Adderall, hydrocodone, and most recently oxycodone. In the month of November she received #40 oxycodone 5/325 on November 7, #30 hydrocodone 10/325 on November 13, #60 oxycodone 10 mg tablets on November 18, #30 Adderall on November 21. She has been getting anywhere from 90-160 hydrocodone monthly. When I review back she's been getting similar prescriptions and usage for at least the past 2 and half years.  Although she only has 2 health care providers writing her medications she does get her medications filled at 6 different pharmacies.  Labs Review Results for orders placed or performed during the hospital encounter of 10/08/14  Pregnancy, urine  Result Value Ref Range   Preg Test, Ur NEGATIVE NEGATIVE  Urinalysis, Routine w reflex microscopic  Result Value Ref Range   Color, Urine YELLOW YELLOW   APPearance CLEAR CLEAR   Specific Gravity, Urine 1.025 1.005 - 1.030   pH 6.0 5.0 - 8.0   Glucose, UA NEGATIVE NEGATIVE mg/dL   Hgb urine dipstick LARGE (A) NEGATIVE   Bilirubin Urine NEGATIVE NEGATIVE   Ketones, ur NEGATIVE NEGATIVE mg/dL   Protein, ur NEGATIVE NEGATIVE mg/dL   Urobilinogen, UA 0.2 0.0 - 1.0 mg/dL   Nitrite NEGATIVE NEGATIVE   Leukocytes, UA NEGATIVE NEGATIVE  Basic metabolic panel  Result Value Ref Range   Sodium 140 137 - 147 mEq/L   Potassium 4.1 3.7 - 5.3 mEq/L   Chloride 102 96 - 112 mEq/L   CO2 29 19 - 32 mEq/L   Glucose, Bld 87 70 - 99 mg/dL   BUN 11 6 - 23 mg/dL   Creatinine, Ser 6.96 0.50 - 1.10 mg/dL   Calcium 8.8 8.4 - 29.5 mg/dL   GFR calc non Af Amer 81 (L) >90 mL/min   GFR calc Af Amer >90 >90 mL/min   Anion gap 9 5 - 15  CBC with Differential  Result Value Ref Range   WBC 4.4 4.0 - 10.5 K/uL   RBC 4.52 3.87 - 5.11 MIL/uL   Hemoglobin 12.7 12.0 - 15.0 g/dL   HCT 28.4 13.2 - 44.0 %   MCV 86.3 78.0 - 100.0 fL   MCH 28.1 26.0 - 34.0 pg   MCHC 32.6 30.0 - 36.0 g/dL   RDW 10.2 72.5 - 36.6 %   Platelets 216 150 - 400 K/uL   Neutrophils Relative % 36 (L) 43 - 77 %   Neutro Abs 1.6 (L) 1.7 - 7.7 K/uL   Lymphocytes Relative 49 (H) 12 - 46 %   Lymphs Abs 2.1 0.7 - 4.0 K/uL   Monocytes Relative 12 3 - 12 %   Monocytes Absolute 0.5 0.1 - 1.0 K/uL   Eosinophils Relative 2 0 - 5 %   Eosinophils Absolute 0.1 0.0 - 0.7 K/uL   Basophils Relative 1 0 - 1 %   Basophils Absolute 0.0 0.0 - 0.1 K/uL  Urine microscopic-add on  Result Value Ref  Range    Squamous Epithelial / LPF FEW (A) RARE   WBC, UA 0-2 <3 WBC/hpf   RBC / HPF TOO NUMEROUS TO COUNT <3 RBC/hpf   Bacteria, UA RARE RARE   Laboratory interpretation all normal except hematuria possible from foley or the bladder stone   Imaging Review Ct Abdomen Pelvis W Contrast  10/08/2014   CLINICAL DATA:  Passed a kidney stone Monday night. Unable to urinate. Pain to right flank.  EXAM: CT ABDOMEN AND PELVIS WITH CONTRAST  TECHNIQUE: Multidetector CT imaging of the abdomen and pelvis was performed using the standard protocol following bolus administration of intravenous con528PiedadAl528PiedadAll528PiedadAl528PiedadAll528PiedadAll528PiedadAll528PiedadAll528PiedadAll528PiedadAll528PiedadAlli528PiedadAll528PiedadAll528PiedadAll528PiedadAll528PiedadAll528PiedadAll528PiedadAll528PiedadAll528PiedadAll528PiedadAll528PiedadAl528PiedadAll528PiedadAll528PiedadAll528PiedadAll528PiedadAll528PiedadAl528PiedadAll528PiedadAl528PiedadAllison QuarryKentuckylimesuarryKentuc liOllen GWUVictorinoBjorn [id number] Hide<MEASUR 9 1,012.  FINDINGS: Lower chest:  Lung bases are clear.  No pericardial fluid.  Hepatobiliary: No focal hepatic lesion. The mild intra and extrahepatic with dilatation related to cholecystectomy  Pancreas: Pancreas is normal.  Spleen: Normal spleen.  Adrenals/urinary tract: Adrenal glands normal. Kidneys enhance symmetrically. No obstructive uropathy. No ureterolithiasis. Small calculus is has shifted to the right aspect of the bladder from the left on comparison exam and measures 1 mm on image 71, series 2. Foley catheter in the bladder.  Stomach/Bowel: Stomach, small bowel, appendix cecum normal. The colon rectosigmoid colon normal.  Vascular/Lymphatic: Abdominal aorta is normal caliber. There is no retroperitoneal or periportal lymphadenopathy. No pelvic lymphadenopathy.  Reproductive: Uterus bladder normal.  Musculoskeletal: Aggressive osseous lesion  Other: No free fluid.  IMPRESSION: 1. Small calculus within the lumen of the bladder. 2. Foley catheter in  bladder. 3. No renal abnormality. 4. Normal appendix. 5. Mild intra and extra hepatic lobe duct dilatation related to cholecystectomy.   Electronically Signed   By: Stewart  Edmunds M.D.   On: 10/08/2014 19:59   Ct Renal Stone Study  10/08/2014   CLINICAL  DATA:  Passed a kidney stone Monday night. Unable to urinate. Pain to right flank.  EXAM: CT ABDOMEN AND PELVIS WITHOUT CONTRAST  TECHNIQUE: Multidetector CT imaging of the abdomen and pelvis was performed following the standard protocol without IV contrast.  COMPARISON:  CT 01/12/2011  FINDINGS: Lung fields:  Lung bases are clear.  Hepatobiliary: No focal hepatic lesions non contrast exam. Post cholecystectomy.  Pancreas: Pancreas is normal. No ductal dilatation. No pancreatic inflammation.  Spleen: Normal spleen.  Adrenals/urinary tract: Adrenal glands are normal. No nephrolithiasis or ureterolithiasis. There is small stone within the posterior left aspect bladder on image 69. This stone measures 1 to 2 mm. There is a Foley catheter the bladder.  Stomach/Bowel: Stomach, small bowel, appendix, cecum are normal. There is moderate volume stool throughout the colon.  Vascular/Lymphatic: Level aorta is normal caliber. No retroperitoneal periportal lymphadenopathy.  Reproductive: Uterus is lobular. The ovaries are normal. No free fluid the pelvis. Pelvic lymphadenopathy.  Musculoskeletal:  no aggressive osseous lesion.  Other: No abdominal hernia  IMPRESSION: 1. Tiny calculus within the left aspect of the bladder. 2.  Foley catheter within the bladder. 3. No nephrolithiasis. 4. Normal appendix   Electronically Signed   By: Stewart  Edmunds M.D.   On: 10/08/2014 18:26     EKG Interpretation None      MDM   Final diagnoses:  Acute right flank pain  Urinary retention  Bladder stone   New Prescriptions   CYCLOBENZAPRINE (FLEXERIL) 10 MG TABLET    Take 1 tablet (10 mg total) by mouth 3 (three) times daily as needed (muscle soreness).  NAPROXEN (NAPROSYN) 500 MG TABLET    Take 1 tablet (500 mg total) by mouth 2 (two) times daily.    Plan discharge      .I personally performed the services described in this documentation, which was scribed in my presence. The recorded information has been reviewed  and considered.  Devoria AlbeIva Sehaj Kolden, MD, Armando GangFACEP    Ward GivensIva L Destanie Tibbetts, MD 10/08/14 2115

## 2014-10-08 NOTE — ED Notes (Signed)
Passed a kidney stone Monday night.  Unable to urinate.  Pain to right flank.

## 2017-09-13 ENCOUNTER — Emergency Department (HOSPITAL_COMMUNITY): Payer: Managed Care, Other (non HMO)

## 2017-09-13 ENCOUNTER — Encounter (HOSPITAL_COMMUNITY): Payer: Self-pay | Admitting: Emergency Medicine

## 2017-09-13 ENCOUNTER — Emergency Department (HOSPITAL_COMMUNITY)
Admission: EM | Admit: 2017-09-13 | Discharge: 2017-09-14 | Disposition: A | Discharge status: Home / Self Care | Payer: Managed Care, Other (non HMO) | Attending: Emergency Medicine | Admitting: Emergency Medicine

## 2017-09-13 DIAGNOSIS — R102 Pelvic and perineal pain: Secondary | ICD-10-CM

## 2017-09-13 DIAGNOSIS — F909 Attention-deficit hyperactivity disorder, unspecified type: Secondary | ICD-10-CM | POA: Insufficient documentation

## 2017-09-13 DIAGNOSIS — N83202 Unspecified ovarian cyst, left side: Secondary | ICD-10-CM | POA: Diagnosis not present

## 2017-09-13 DIAGNOSIS — N939 Abnormal uterine and vaginal bleeding, unspecified: Secondary | ICD-10-CM | POA: Diagnosis not present

## 2017-09-13 LAB — BASIC METABOLIC PANEL
ANION GAP: 6 (ref 5–15)
BUN: 11 mg/dL (ref 6–20)
CALCIUM: 7.7 mg/dL — AB (ref 8.9–10.3)
CO2: 21 mmol/L — ABNORMAL LOW (ref 22–32)
Chloride: 109 mmol/L (ref 101–111)
Creatinine, Ser: 0.59 mg/dL (ref 0.44–1.00)
GFR calc Af Amer: >60 mL/min (ref >60)
GFR calc non Af Amer: >60 mL/min (ref >60)
Glucose, Bld: 86 mg/dL (ref 65–99)
Potassium: 3.6 mmol/L (ref 3.5–5.1)
Sodium: 136 mmol/L (ref 135–145)

## 2017-09-13 LAB — CBC WITH DIFFERENTIAL/PLATELET
BASOS ABS: 0.1 10*3/uL (ref 0.0–0.1)
Basophils Relative: 1 %
Eosinophils Absolute: 0.2 10*3/uL (ref 0.0–0.7)
Eosinophils Relative: 2 %
HCT: 32 % — ABNORMAL LOW (ref 36.0–46.0)
Hemoglobin: 10.3 g/dL — ABNORMAL LOW (ref 12.0–15.0)
Lymphocytes Relative: 28 %
Lymphs Abs: 2.5 10*3/uL (ref 0.7–4.0)
MCH: 27.2 pg (ref 26.0–34.0)
MCHC: 32.2 g/dL (ref 30.0–36.0)
MCV: 84.7 fL (ref 78.0–100.0)
Monocytes Absolute: 0.7 10*3/uL (ref 0.1–1.0)
Monocytes Relative: 8 %
NEUTROS ABS: 5.5 10*3/uL (ref 1.7–7.7)
Neutrophils Relative %: 61 %
Platelets: 281 10*3/uL (ref 150–400)
RBC: 3.78 MIL/uL — ABNORMAL LOW (ref 3.87–5.11)
RDW: 13.7 % (ref 11.5–15.5)
WBC: 8.9 10*3/uL (ref 4.0–10.5)

## 2017-09-13 LAB — TYPE AND SCREEN
ABO/RH(D): O POS
Antibody Screen: NEGATIVE

## 2017-09-13 MED ORDER — SODIUM CHLORIDE 0.9 % IV BOLUS (SEPSIS)
1000.0000 mL | Freq: Once | INTRAVENOUS | Status: AC
  Administered 2017-09-13: 1000 mL via INTRAVENOUS

## 2017-09-13 MED ORDER — HYDROCODONE-ACETAMINOPHEN 5-325 MG PO TABS: 2.0000 | ORAL_TABLET | ORAL | 0 refills | Status: DC | PRN

## 2017-09-13 MED ORDER — ACETAMINOPHEN 325 MG PO TABS
650.0000 mg | ORAL_TABLET | Freq: Once | ORAL | Status: AC
  Administered 2017-09-14: 650 mg via ORAL
  Filled 2017-09-13: qty 2

## 2017-09-13 NOTE — Discharge Instructions (Signed)
As discussed, call your OB/GYN in the morning for follow up. Your ultrasound did not show any retained products or ovarian torsion. Left ovarian cyst noted.  Medication prescribed can cross into your milk supply. Make sure to use your pre-pumped milk and talk to your OB regarding pain relief tomorrow.  Return if symptoms worsen in the meantime.

## 2017-09-13 NOTE — ED Triage Notes (Addendum)
Pt delivered baby Thursday at [facility] and had complication with cervix infection. Pt c/o increased vaginal bleeding and pelvic pain since yesterday. Pt states since yesterday she has been having to change menstrual pads every hour.

## 2017-09-14 NOTE — ED Provider Notes (Signed)
[facility] EMERGENCY DEPARTMENT Provider Note   CSN: [id number] Arrival date & time: 09/13/17  1859     History   Chief Complaint Chief Complaint  Patient presents with  . Vaginal Bleeding    HPI Bethany White is a 31 y.o. female presenting one week post partum with vaginal bleeding and vaginal/cervical pain. She experienced increased bleeding yesterday which has now improved. She was having to change her pad every hour. Now every 2 hours or so. She reports that the pain is severe and she has never experienced anything like this with her first child. She contacted her OB/gyn but could not have [place] outpatient until next Tuesday. They recommended to come to the emergency department for evaluation of bleeding and [place]. She denies any other symptoms, no fever, chills, nausea, vomiting, dizziness, lightheadedness, sob.  HPI  Past Medical History:  Diagnosis Date  . Attention deficit disorder   . Seizures (HCC)     There are no active problems to display for this patient.   Past Surgical History:  Procedure Laterality Date  . ADENOIDECTOMY    . CHOLECYSTECTOMY    . TONSILLECTOMY    . xyphoid process removed      OB History    No data available       Home Medications    Prior to Admission medications   Medication Sig Start Date End Date Taking? Authorizing Provider  amphetamine-dextroamphetamine (ADDERALL) 20 MG tablet Take 10 mg by mouth daily as needed.  10/01/14  Yes [provider]  cyclobenzaprine (FLEXERIL) 10 MG tablet Take 1 tablet (10 mg total) by mouth 3 (three) times daily as needed (muscle soreness). 10/08/14  Yes Devoria Albe, MD  docusate sodium (COLACE) 100 MG capsule Take 100 mg by mouth daily.  09/09/17 09/09/18 Yes [provider]  gabapentin (NEURONTIN) 100 MG capsule Take 100 mg by mouth 3 (three) times daily. 09/09/17  Yes [provider]  Prenatal Vit-Fe Fumarate-FA (PRENATAL 1+1 PO) Take 1 tablet by mouth daily.   Yes  [provider]  HYDROcodone-acetaminophen (NORCO/VICODIN) 5-325 MG tablet Take 2 tablets by mouth every 4 (four) hours as needed. 09/13/17   Georgiana Shore, PA-C    Family History History reviewed. No pertinent family history.  Social History Social History  Substance Use Topics  . Smoking status: Never Smoker  . Smokeless tobacco: Never Used  . Alcohol use No     Allergies   Lamictal [lamotrigine]   Review of Systems Review of Systems  Constitutional: Negative for chills, diaphoresis, fatigue and fever.  Respiratory: Negative for cough, shortness of breath, wheezing and stridor.   Cardiovascular: Negative for chest pain and palpitations.  Gastrointestinal: Negative for abdominal pain, nausea and vomiting.  Genitourinary: Positive for vaginal bleeding and vaginal pain. Negative for difficulty urinating, dysuria, flank pain and hematuria.  Skin: Negative for color change, pallor and rash.  Neurological: Negative for dizziness, seizures, syncope, weakness, light-headedness and headaches.     Physical Exam Updated Vital Signs BP (!) 132/97 (BP Location: Right Arm)   Pulse 92   Temp 98.5 F (36.9 C) (Oral)   Resp 18   Ht 5\' 5"  (1.651 m)   Wt 65.8 kg (145 lb)   SpO2 98%   BMI 24.13 kg/m   Physical Exam  Constitutional: She appears well-developed and well-nourished. No distress.  Afebrile, nontoxic-appearing, sitting comfortably in bed in no acute distress.  HENT:  Head: Normocephalic and atraumatic.  Eyes: Conjunctivae are normal.  Neck:  Neck supple.  Cardiovascular: Normal rate, regular rhythm, normal heart sounds and intact distal pulses.   No murmur heard. Pulmonary/Chest: Effort normal and breath sounds normal. No respiratory distress. She has no wheezes. She has no rales.  Abdominal: Soft. She exhibits no distension and no mass. There is no tenderness. There is no rebound and no guarding.  Genitourinary:  Genitourinary Comments: Unremarkable  amount of blood in the vaginal vault. Serous thick nonpurulent discharge from cervical os.   Musculoskeletal: Normal range of motion. She exhibits no edema.  Neurological: She is alert.  Skin: Skin is warm and dry. No rash noted. She is not diaphoretic. No erythema. No pallor.  Psychiatric: She has a normal mood and affect.  Nursing note and vitals reviewed.    ED Treatments / Results  Labs (all labs ordered are listed, but only abnormal results are displayed) Labs Reviewed  CBC WITH DIFFERENTIAL/PLATELET - Abnormal; Notable for the following:       Result Value   RBC 3.78 (*)    Hemoglobin 10.3 (*)    HCT 32.0 (*)    All other components within normal limits  BASIC METABOLIC PANEL - Abnormal; Notable for the following:    CO2 21 (*)    Calcium 7.7 (*)    All other components within normal limits  TYPE AND SCREEN    EKG  EKG Interpretation None       Radiology US Transvaginal Non-ob  Result Date: 09/13/2017 CLINICAL DATA:  31 year old female with postpartum bleeding and pain. EXAM: TRANSABDOMINAL AND TRANSVAGINAL ULTRASOUND OF PELVIS DOPPLER ULTRASOUND OF OVARIES TECHNIQUE: Both transabdominal and transvaginal ultrasound examinations of the pelvis were performed. Transabdominal technique was performed for global imaging of the pelvis including uterus, ovaries, adnexal regions, and pelvic cul-de-sac. It was necessary to proceed with endovaginal exam following the transabdominal exam to visualize the endometrium and ovaries. Color and duplex Doppler ultrasound was utilized to evaluate blood flow to the ovaries. COMPARISON:  Abdominal CT dated 10/08/2014 FINDINGS: Uterus Measurements: 14 x 8 x 10 cm. The uterus is slightly hyperemic, likely related to postpartum status. No focal mass. Endometrium Thickness: 14 mm.  No focal abnormality visualized. Right ovary Measurements: 3.4 x 1.7 x 1.9 cm. Normal appearance/no adnexal mass. Left ovary Measurements: 2.0 x 3.0 x 1.6 cm. There is a  2.7 x 1.9 x 2.7 cm ovarian or paraovarian cyst. Pulsed Doppler evaluation of both ovaries demonstrates normal low-resistance arterial and venous waveforms. Other findings Low-level echogenic debris noted within the urinary bladder. Correlation with the clinical exam and urinalysis recommended. IMPRESSION: 1. Unremarkable endometrium and uterus. No evidence of retained products of conception. 2. Unremarkable ovaries.  Bilateral ovarian Doppler detected flow. Electronically Signed   By: Elgie Collard M.D.   On: 09/13/2017 22:14   US Pelvis Complete  Result Date: 09/13/2017 CLINICAL DATA:  31 year old female with postpartum bleeding and pain. EXAM: TRANSABDOMINAL AND TRANSVAGINAL ULTRASOUND OF PELVIS DOPPLER ULTRASOUND OF OVARIES TECHNIQUE: Both transabdominal and transvaginal ultrasound examinations of the pelvis were performed. Transabdominal technique was performed for global imaging of the pelvis including uterus, ovaries, adnexal regions, and pelvic cul-de-sac. It was necessary to proceed with endovaginal exam following the transabdominal exam to visualize the endometrium and ovaries. Color and duplex Doppler ultrasound was utilized to evaluate blood flow to the ovaries. COMPARISON:  Abdominal CT dated 10/08/2014 FINDINGS: Uterus Measurements: 14 x 8 x 10 cm. The uterus is slightly hyperemic, likely related to postpartum status. No focal mass. Endometrium Thickness: 14 mm.  No focal abnormality visualized. Right ovary Measurements: 3.4 x 1.7 x 1.9 cm. Normal appearance/no adnexal mass. Left ovary Measurements: 2.0 x 3.0 x 1.6 cm. There is a 2.7 x 1.9 x 2.7 cm ovarian or paraovarian cyst. Pulsed Doppler evaluation of both ovaries demonstrates normal low-resistance arterial and venous waveforms. Other findings Low-level echogenic debris noted within the urinary bladder. Correlation with the clinical exam and urinalysis recommended. IMPRESSION: 1. Unremarkable endometrium and uterus. No evidence of retained  products of conception. 2. Unremarkable ovaries.  Bilateral ovarian Doppler detected flow. Electronically Signed   By: Elgie Collard M.D.   On: 09/13/2017 22:14   [place] Art/ven Flow Abd Pelv Doppler  Result Date: 09/13/2017 CLINICAL DATA:  31 year old female with postpartum bleeding and pain. EXAM: TRANSABDOMINAL AND TRANSVAGINAL ULTRASOUND OF PELVIS DOPPLER ULTRASOUND OF OVARIES TECHNIQUE: Both transabdominal and transvaginal ultrasound examinations of the pelvis were performed. Transabdominal technique was performed for global imaging of the pelvis including uterus, ovaries, adnexal regions, and pelvic cul-de-sac. It was necessary to proceed with endovaginal exam following the transabdominal exam to visualize the endometrium and ovaries. Color and duplex Doppler ultrasound was utilized to evaluate blood flow to the ovaries. COMPARISON:  Abdominal CT dated 10/08/2014 FINDINGS: Uterus Measurements: 14 x 8 x 10 cm. The uterus is slightly hyperemic, likely related to postpartum status. No focal mass. Endometrium Thickness: 14 mm.  No focal abnormality visualized. Right ovary Measurements: 3.4 x 1.7 x 1.9 cm. Normal appearance/no adnexal mass. Left ovary Measurements: 2.0 x 3.0 x 1.6 cm. There is a 2.7 x 1.9 x 2.7 cm ovarian or paraovarian cyst. Pulsed Doppler evaluation of both ovaries demonstrates normal low-resistance arterial and venous waveforms. Other findings Low-level echogenic debris noted within the urinary bladder. Correlation with the clinical exam and urinalysis recommended. IMPRESSION: 1. Unremarkable endometrium and uterus. No evidence of retained products of conception. 2. Unremarkable ovaries.  Bilateral ovarian Doppler detected flow. Electronically Signed   By: Elgie Collard M.D.   On: 09/13/2017 22:14    Procedures Procedures (including critical care time)  Medications Ordered in ED Medications  sodium chloride 0.9 % bolus 1,000 mL (0 mLs Intravenous Stopped 09/14/17 0015)    acetaminophen (TYLENOL) tablet 650 mg (650 mg Oral Given 09/14/17 0013)     Initial Impression / Assessment and Plan / ED Course  I have reviewed the triage vital signs and the nursing notes.  Pertinent labs & imaging results that were available during my care of the patient were reviewed by me and considered in my medical decision making (see chart for details).    Patient's bleeding improved, no significant bleeding or indication of hemorrhage on pelvic exam.  Reassuring ultrasound, no retained product, unremarkable uterus and ovaries. Was given IV fluids Patient is hemodynamically stable  Labs unremarkable.  Patient discharged home with symptomatic relief and follow up with OB/GYN in the morning. She was advised that medication crosses into milk and should hold breast-feeding and use her previously pumped milk until she speaks with her OB/GYN.  Discussed strict return precautions and advised to return to the emergency department if experiencing any new or worsening symptoms. Instructions were understood and patient agreed with discharge plan.  Final Clinical Impressions(s) / ED Diagnoses   Final diagnoses:  Vaginal bleeding  Vaginal pain  Cyst of left ovary    New Prescriptions Discharge Medication List as of 09/13/2017 11:54 PM    START taking these medications   Details  HYDROcodone-acetaminophen (NORCO/VICODIN) 5-325 MG tablet Take 2 tablets by mouth  every 4 (four) hours as needed., Starting Wed 09/13/2017, Print         Georgiana ShoreMitchell, Sarahbeth Cashin B, PA-C 09/14/17 0048    Tilden Fossaees, Elizabeth, MD 09/16/17 [phone]

## 2018-12-02 DIAGNOSIS — R6889 Other general symptoms and signs: Secondary | ICD-10-CM | POA: Diagnosis not present

## 2018-12-02 DIAGNOSIS — J029 Acute pharyngitis, unspecified: Secondary | ICD-10-CM | POA: Diagnosis not present

## 2018-12-02 DIAGNOSIS — R05 Cough: Secondary | ICD-10-CM | POA: Diagnosis not present

## 2019-11-05 ENCOUNTER — Telehealth: Payer: Self-pay | Admitting: *Deleted

## 2019-11-05 NOTE — Telephone Encounter (Signed)
Patient called for test results- they are not in the chart- call to [facility]- verbal result is not detected. Call to patient- notified negative for COVID.

## 2020-04-18 DIAGNOSIS — R109 Unspecified abdominal pain: Secondary | ICD-10-CM | POA: Diagnosis not present

## 2020-04-18 DIAGNOSIS — N390 Urinary tract infection, site not specified: Secondary | ICD-10-CM | POA: Diagnosis not present

## 2020-04-18 DIAGNOSIS — Z87442 Personal history of urinary calculi: Secondary | ICD-10-CM | POA: Diagnosis not present

## 2023-09-17 ENCOUNTER — Telehealth: Payer: Self-pay | Admitting: *Deleted

## 2023-09-17 ENCOUNTER — Ambulatory Visit: Payer: 59

## 2023-09-17 ENCOUNTER — Other Ambulatory Visit: Payer: Self-pay

## 2023-09-17 ENCOUNTER — Encounter: Payer: Self-pay | Admitting: *Deleted

## 2023-09-17 ENCOUNTER — Ambulatory Visit
Admission: EM | Admit: 2023-09-17 | Discharge: 2023-09-17 | Disposition: A | Discharge status: Home / Self Care | Payer: 59 | Attending: Internal Medicine | Admitting: Internal Medicine

## 2023-09-17 DIAGNOSIS — R059 Cough, unspecified: Secondary | ICD-10-CM

## 2023-09-17 DIAGNOSIS — J069 Acute upper respiratory infection, unspecified: Secondary | ICD-10-CM

## 2023-09-17 DIAGNOSIS — R053 Chronic cough: Secondary | ICD-10-CM

## 2023-09-17 MED ORDER — PROMETHAZINE-DM 6.25-15 MG/5ML PO SYRP: 5.0000 mL | ORAL_SOLUTION | Freq: Four times a day (QID) | ORAL | 0 refills | Status: DC | PRN

## 2023-09-17 MED ORDER — ALBUTEROL SULFATE HFA 108 (90 BASE) MCG/ACT IN AERS: 1.0000 | INHALATION_SPRAY | Freq: Four times a day (QID) | RESPIRATORY_TRACT | 0 refills | Status: DC | PRN

## 2023-09-17 MED ORDER — FLUTICASONE PROPIONATE 50 MCG/ACT NA SUSP: 1.0000 | Freq: Every day | NASAL | 0 refills | Status: AC

## 2023-09-17 MED ORDER — PROMETHAZINE-DM 6.25-15 MG/5ML PO SYRP: 5.0000 mL | ORAL_SOLUTION | Freq: Four times a day (QID) | ORAL | 0 refills | Status: AC | PRN

## 2023-09-17 MED ORDER — FLUTICASONE PROPIONATE 50 MCG/ACT NA SUSP: 1.0000 | Freq: Every day | NASAL | 0 refills | Status: DC

## 2023-09-17 MED ORDER — ALBUTEROL SULFATE HFA 108 (90 BASE) MCG/ACT IN AERS: 1.0000 | INHALATION_SPRAY | Freq: Four times a day (QID) | RESPIRATORY_TRACT | 0 refills | Status: AC | PRN

## 2023-09-17 NOTE — Telephone Encounter (Signed)
After discharge pt requested prescriptions to be changed to [facility] on [address]

## 2023-09-17 NOTE — Discharge Instructions (Signed)
X-ray is pending.  I will call if it is abnormal.  I have sent you a few medications for symptoms.  Cough medication can make you drowsy.  Follow-up if any symptoms persist or worsen.

## 2023-09-17 NOTE — ED Triage Notes (Addendum)
Pt reports feeling bad x 1 week. Was seen at [facility] on Thursday and was tested for flu/covid/strep which were all neg. She has almost finished course of amoxicillin and tried taking tessalon pearles but "not helping cough". Concerned for bronchitis. She just completed prednisone that was prescribed at Urgent Care also

## 2023-09-17 NOTE — ED Provider Notes (Signed)
[facility]    CSN: [id number] Arrival date & time: 09/17/23  1436      History   Chief Complaint Chief Complaint  Patient presents with   Cough    HPI ALIEGHA PAULLIN is a 37 y.o. female.   Patient presents with approximately 7 to 8-day history of nasal congestion and coughing.  Patient presented to a different urgent care when symptoms first suspect viral cause started and had negative viral testing and strep testing.  She was prescribed prednisone steroid burst and given IM Decadron with minimal improvement in symptoms.  Also reports she has been taking leftover benzonatate and leftover amoxicillin for symptoms.  Reports cough is worse at night and is productive at times.  Reports that she had a fever yesterday with Tmax of 101.  Denies history of asthma.  Denies chest pain or shortness of breath.  Reports known sick contact.   Cough   Past Medical History:  Diagnosis Date   Attention deficit disorder    Seizures (HCC)     There are no problems to display for this patient.   Past Surgical History:  Procedure Laterality Date   ADENOIDECTOMY     CHOLECYSTECTOMY     TONSILLECTOMY     xyphoid process removed      OB History   No obstetric history on file.      Home Medications    Prior to Admission medications   Medication Sig Start Date End Date Taking? Authorizing Provider  albuterol (VENTOLIN HFA) 108 (90 Base) MCG/ACT inhaler Inhale 1-2 puffs into the lungs every 6 (six) hours as needed for wheezing or shortness of breath. 09/17/23   Gustavus Bryant, FNP  amphetamine-dextroamphetamine (ADDERALL) 20 MG tablet Take 10 mg by mouth daily as needed.  10/01/14   [provider]  cyclobenzaprine (FLEXERIL) 10 MG tablet Take 1 tablet (10 mg total) by mouth 3 (three) times daily as needed (muscle soreness). 10/08/14   Devoria Albe, MD  fluticasone (FLONASE) 50 MCG/ACT nasal spray Place 1 spray into both nostrils daily. 09/17/23   Gustavus Bryant, FNP   gabapentin (NEURONTIN) 100 MG capsule Take 100 mg by mouth 3 (three) times daily. 09/09/17   [provider]  HYDROcodone-acetaminophen (NORCO/VICODIN) 5-325 MG tablet Take 2 tablets by mouth every 4 (four) hours as needed. 09/13/17   Georgiana Shore, PA-C  Prenatal Vit-Fe Fumarate-FA (PRENATAL 1+1 PO) Take 1 tablet by mouth daily.    [provider]  promethazine-dextromethorphan (PROMETHAZINE-DM) 6.25-15 MG/5ML syrup Take 5 mLs by mouth every 6 (six) hours as needed for cough. 09/17/23   Gustavus Bryant, FNP    Family History No family history on file.  Social History Social History   Tobacco Use   Smoking status: Never   Smokeless tobacco: Never  Vaping Use   Vaping status: Never Used  Substance Use Topics   Alcohol use: No   Drug use: No     Allergies   Lamictal [lamotrigine]   Review of Systems Review of Systems Per HPI  Physical Exam Triage Vital Signs ED Triage Vitals  Encounter Vitals Group     BP 09/17/23 1457 112/75     Systolic BP Percentile --      Diastolic BP Percentile --      Pulse Rate 09/17/23 1457 97     Resp 09/17/23 1457 18     Temp 09/17/23 1457 98.4 F (36.9 C)     Temp Source 09/17/23 1457 Oral  SpO2 09/17/23 1457 97 %     Weight --      Height --      Head Circumference --      Peak Flow --      Pain Score 09/17/23 1451 8     Pain Loc --      Pain Education --      Exclude from Growth Chart --    No data found.  Updated Vital Signs BP 112/75 (BP Location: Left Arm)   Pulse 97   Temp 98.4 F (36.9 C) (Oral)   Resp 18   LMP 09/11/2023   SpO2 97%   Visual Acuity Right Eye Distance:   Left Eye Distance:   Bilateral Distance:    Right Eye Near:   Left Eye Near:    Bilateral Near:     Physical Exam Constitutional:      General: She is not in acute distress.    Appearance: Normal appearance. She is not toxic-appearing or diaphoretic.  HENT:     Head: Normocephalic and atraumatic.     Right Ear:  Tympanic membrane and ear canal normal.     Left Ear: Tympanic membrane and ear canal normal.     Nose: Congestion present.     Mouth/Throat:     Mouth: Mucous membranes are moist.     Pharynx: No posterior oropharyngeal erythema.  Eyes:     Extraocular Movements: Extraocular movements intact.     Conjunctiva/sclera: Conjunctivae normal.     Pupils: Pupils are equal, round, and reactive to light.  Cardiovascular:     Rate and Rhythm: Normal rate and regular rhythm.     Pulses: Normal pulses.     Heart sounds: Normal heart sounds.  Pulmonary:     Effort: Pulmonary effort is normal. No respiratory distress.     Breath sounds: Normal breath sounds. No stridor. No wheezing, rhonchi or rales.  Abdominal:     General: Abdomen is flat. Bowel sounds are normal.     Palpations: Abdomen is soft.  Musculoskeletal:        General: Normal range of motion.     Cervical back: Normal range of motion.  Skin:    General: Skin is warm and dry.  Neurological:     General: No focal deficit present.     Mental Status: She is alert and oriented to person, place, and time. Mental status is at baseline.  Psychiatric:        Mood and Affect: Mood normal.        Behavior: Behavior normal.      UC Treatments / Results  Labs (all labs ordered are listed, but only abnormal results are displayed) Labs Reviewed - No data to display  EKG   Radiology DG Chest 2 View  Result Date: 09/17/2023 CLINICAL DATA:  Cough for 1 week. EXAM: CHEST - 2 VIEW COMPARISON:  None Available. FINDINGS: The heart size and mediastinal contours are within normal limits. Both lungs are clear. Pectus excavatum incidentally noted. IMPRESSION: No active cardiopulmonary disease. Pectus excavatum. Electronically Signed   By: Danae Orleans M.D.   On: 09/17/2023 15:56    Procedures Procedures (including critical care time)  Medications Ordered in UC Medications - No data to display  Initial Impression / Assessment and Plan / UC  Course  I have reviewed the triage vital signs and the nursing notes.  Pertinent labs & imaging results that were available during my care of the patient were reviewed by  me and considered in my medical decision making (see chart for details).     Suspect viral cause of symptoms.  Most likely viral bronchitis.  Chest x-ray completed that was negative for any acute cardiopulmonary process.  It does show pectus excavatum which appears to be noted on previous imaging as well.  Attempted to call patient to discuss this but it kept saying "call could not be completed as dialed" and it would not go through.  Will prescribe Flonase, albuterol inhaler, Promethazine DM for cough.  Patient reports that she does not take any daily medications so this be safe but advised patient that cough medication can make her drowsy.  I do not see any indication for antibiotic therapy on exam at this time.  Advised strict follow-up precautions if any symptoms persist or worsen.  Patient verbalized understanding and was agreeable with plan. Final Clinical Impressions(s) / UC Diagnoses   Final diagnoses:  None     Discharge Instructions      X-ray is pending.  I will call if it is abnormal.  I have sent you a few medications for symptoms.  Cough medication can make you drowsy.  Follow-up if any symptoms persist or worsen.    ED Prescriptions     Medication Sig Dispense Auth. Provider   fluticasone (FLONASE) 50 MCG/ACT nasal spray Place 1 spray into both nostrils daily. 16 g Ervin Knack E, [place]   promethazine-dextromethorphan (PROMETHAZINE-DM) 6.25-15 MG/5ML syrup Take 5 mLs by mouth every 6 (six) hours as needed for cough. 118 mL Willia Genrich, Rolly Salter E, [place]   albuterol (VENTOLIN HFA) 108 (90 Base) MCG/ACT inhaler Inhale 1-2 puffs into the lungs every 6 (six) hours as needed for wheezing or shortness of breath. 1 each Gustavus Bryant, [place]      PDMP not reviewed this encounter.   Gustavus Bryant, [place] 09/17/23 [phone]

## 2024-02-18 ENCOUNTER — Ambulatory Visit

## 2024-03-31 ENCOUNTER — Encounter: Payer: Self-pay | Admitting: Emergency Medicine

## 2024-03-31 ENCOUNTER — Ambulatory Visit
Admission: EM | Admit: 2024-03-31 | Discharge: 2024-03-31 | Disposition: A | Discharge status: Home / Self Care | Attending: Internal Medicine | Admitting: Internal Medicine

## 2024-03-31 DIAGNOSIS — Z98818 Other dental procedure status: Secondary | ICD-10-CM

## 2024-03-31 DIAGNOSIS — K0889 Other specified disorders of teeth and supporting structures: Secondary | ICD-10-CM | POA: Diagnosis not present

## 2024-03-31 MED ORDER — LIDOCAINE VISCOUS HCL 2 % MT SOLN: 15.0000 mL | OROMUCOSAL | 0 refills | Status: AC | PRN

## 2024-03-31 MED ORDER — HYDROCODONE-ACETAMINOPHEN 5-325 MG PO TABS: 1.0000 | ORAL_TABLET | Freq: Four times a day (QID) | ORAL | 0 refills | Status: AC | PRN

## 2024-03-31 MED ORDER — LIDOCAINE VISCOUS HCL 2 % MT SOLN
15.0000 mL | Freq: Once | OROMUCOSAL | Status: AC
  Administered 2024-03-31: 15 mL via OROMUCOSAL

## 2024-03-31 MED ORDER — KETOROLAC TROMETHAMINE 30 MG/ML IJ SOLN
30.0000 mg | Freq: Once | INTRAMUSCULAR | Status: AC
  Administered 2024-03-31: 30 mg via INTRAMUSCULAR

## 2024-03-31 NOTE — ED Provider Notes (Signed)
 [facility]    CSN: [id number] Arrival date & time: 03/31/24  0803      History   Chief Complaint Chief Complaint  Patient presents with   Dental Pain    HPI Bethany White is a 38 y.o. female.   38 year old female who presents urgent care with complaints of severe pain in the right upper jaw and across her face status post wisdom tooth extraction on Thursday.  She had both sides extracted on the upper.  She reports that the right side is the only area that is causing her severe pain.  The pain is keeping her from being able to eat or drink.  She has been using ibuprofen and over-the-counter Orajel with very little improvement.  She did try to contact her dentist but they are not open on the weekends and do not have an on-call service.  She plans to call them first thing tomorrow.  She denies any fevers or chills.     Dental Pain Associated symptoms: no fever     Past Medical History:  Diagnosis Date   Attention deficit disorder    Seizures (HCC)     There are no active problems to display for this patient.   Past Surgical History:  Procedure Laterality Date   ADENOIDECTOMY     CHOLECYSTECTOMY     TONSILLECTOMY     xyphoid process removed      OB History   No obstetric history on file.      Home Medications    Prior to Admission medications   Medication Sig Start Date End Date Taking? Authorizing Provider  HYDROcodone -acetaminophen  (NORCO/VICODIN) 5-325 MG tablet Take 1-2 tablets by mouth every 6 (six) hours as needed. 03/31/24  Yes Jerauld Bostwick A, PA-C  lidocaine (XYLOCAINE) 2 % solution Use as directed 15 mLs in the mouth or throat every 3 (three) hours as needed for mouth pain. 03/31/24  Yes Romyn Boswell A, PA-C  albuterol  (VENTOLIN  HFA) 108 (90 Base) MCG/ACT inhaler Inhale 1-2 puffs into the lungs every 6 (six) hours as needed for wheezing or shortness of breath. 09/17/23   Dodson Freestone, FNP  amphetamine-dextroamphetamine (ADDERALL)  20 MG tablet Take 10 mg by mouth daily as needed.  10/01/14   [provider]  cyclobenzaprine  (FLEXERIL ) 10 MG tablet Take 1 tablet (10 mg total) by mouth 3 (three) times daily as needed (muscle soreness). 10/08/14   Knapp, Iva, MD  fluticasone  (FLONASE ) 50 MCG/ACT nasal spray Place 1 spray into both nostrils daily. 09/17/23   Dodson Freestone, FNP  gabapentin (NEURONTIN) 100 MG capsule Take 100 mg by mouth 3 (three) times daily. 09/09/17   [provider]  Prenatal Vit-Fe Fumarate-FA (PRENATAL 1+1 PO) Take 1 tablet by mouth daily.    [provider]  promethazine -dextromethorphan (PROMETHAZINE -DM) 6.25-15 MG/5ML syrup Take 5 mLs by mouth every 6 (six) hours as needed for cough. 09/17/23   Dodson Freestone, FNP    Family History History reviewed. No pertinent family history.  Social History Social History   Tobacco Use   Smoking status: Never    Passive exposure: Never   Smokeless tobacco: Never  Vaping Use   Vaping status: Never Used  Substance Use Topics   Alcohol use: No   Drug use: No     Allergies   Lamictal [lamotrigine]   Review of Systems Review of Systems  Constitutional:  Negative for chills and fever.  HENT:  Positive for dental problem. Negative for ear  pain and sore throat.   Eyes:  Negative for pain and visual disturbance.  Respiratory:  Negative for cough and shortness of breath.   Cardiovascular:  Negative for chest pain and palpitations.  Gastrointestinal:  Negative for abdominal pain and vomiting.  Genitourinary:  Negative for dysuria and hematuria.  Musculoskeletal:  Negative for arthralgias and back pain.  Skin:  Negative for color change and rash.  Neurological:  Negative for seizures and syncope.  All other systems reviewed and are negative.    Physical Exam Triage Vital Signs ED Triage Vitals  Encounter Vitals Group     BP 03/31/24 0824 110/75     Systolic BP Percentile --      Diastolic BP Percentile --      Pulse Rate  03/31/24 0824 83     Resp 03/31/24 0824 16     Temp 03/31/24 0824 98.4 F (36.9 C)     Temp Source 03/31/24 0824 Oral     SpO2 03/31/24 0824 98 %     Weight 03/31/24 0823 145 lb 1 oz (65.8 kg)     Height --      Head Circumference --      Peak Flow --      Pain Score 03/31/24 0822 9     Pain Loc --      Pain Education --      Exclude from Growth Chart --    No data found.  Updated Vital Signs BP 110/75 (BP Location: Left Arm)   Pulse 83   Temp 98.4 F (36.9 C) (Oral)   Resp 16   Wt 145 lb 1 oz (65.8 kg)   LMP 03/31/2024 (Exact Date)   SpO2 98%   BMI 24.14 kg/m   Visual Acuity Right Eye Distance:   Left Eye Distance:   Bilateral Distance:    Right Eye Near:   Left Eye Near:    Bilateral Near:     Physical Exam Vitals and nursing note reviewed.  Constitutional:      General: She is not in acute distress.    Appearance: She is well-developed.  HENT:     Head: Normocephalic and atraumatic.     Mouth/Throat:   Eyes:     Conjunctiva/sclera: Conjunctivae normal.  Cardiovascular:     Rate and Rhythm: Normal rate and regular rhythm.     Heart sounds: No murmur heard. Pulmonary:     Effort: Pulmonary effort is normal. No respiratory distress.     Breath sounds: Normal breath sounds.  Abdominal:     Palpations: Abdomen is soft.     Tenderness: There is no abdominal tenderness.  Musculoskeletal:        General: No swelling.     Cervical back: Neck supple.  Skin:    General: Skin is warm and dry.     Capillary Refill: Capillary refill takes less than 2 seconds.  Neurological:     Mental Status: She is alert.  Psychiatric:        Mood and Affect: Mood normal.      UC Treatments / Results  Labs (all labs ordered are listed, but only abnormal results are displayed) Labs Reviewed - No data to display  EKG   Radiology No results found.  Procedures Procedures (including critical care time)  Medications Ordered in UC Medications  ketorolac  (TORADOL )  30 MG/ML injection 30 mg (has no administration in time range)  lidocaine (XYLOCAINE) 2 % viscous mouth solution 15 mL (has no administration  in time range)    Initial Impression / Assessment and Plan / UC Course  I have reviewed the triage vital signs and the nursing notes.  Pertinent labs & imaging results that were available during my care of the patient were reviewed by me and considered in my medical decision making (see chart for details).     Pain, dental  Status post wisdom tooth extraction   Physical exam findings does not show an infectious process.  Symptoms most likely associated with dry socket/postoperative pain from wisdom tooth extraction on the right.  We will treat with the following: Toradol  injection given today. This is a medication to help with pain. This is not a narcotic.   Hydrocodone /acetaminophen  5/325 mg 1-2 every 6 hours as needed for severe pain. Do not take tylenol  while taking this medication. Ok to take ibuprofen Lidocaine 15 mls swish for 30 seconds and then spit out. Wait 15 minutes before eating or drinking. May do this every 3 hours as needed for pain Call your dentist first thing tomorrow morning. May return to urgent care as needed.  Final Clinical Impressions(s) / UC Diagnoses   Final diagnoses:  Pain, dental  Status post wisdom tooth extraction     Discharge Instructions      Physical exam findings does not show an infectious process.  Symptoms most likely associated with dry socket/postoperative pain from wisdom tooth extraction on the right.  We will treat with the following: Toradol  injection given today. This is a medication to help with pain. This is not a narcotic.   Hydrocodone /acetaminophen  5/325 mg 1-2 every 6 hours as needed for severe pain. Do not take tylenol  while taking this medication. Ok to take ibuprofen Lidocaine 15 mls swish for 30 seconds and then spit out. Wait 15 minutes before eating or drinking. May do this every 3  hours as needed for pain Call your dentist first thing tomorrow morning. May return to urgent care as needed.   ED Prescriptions     Medication Sig Dispense Auth. Provider   HYDROcodone -acetaminophen  (NORCO/VICODIN) 5-325 MG tablet Take 1-2 tablets by mouth every 6 (six) hours as needed. 10 tablet Makaylin Carlo A, PA-C   lidocaine (XYLOCAINE) 2 % solution Use as directed 15 mLs in the mouth or throat every 3 (three) hours as needed for mouth pain. 100 mL Kreg Pesa, [place]      I have reviewed the PDMP during this encounter.   Kreg Pesa, [place] 03/31/24 [phone]

## 2024-03-31 NOTE — ED Triage Notes (Signed)
 Pt presents with dental pain from dry socket. Pt had 2 wisdom teeth extracted on Thursday and had to have her gums cut to remove the remaining root. Pt says she is in excruciating pain from ear to chin on right side.

## 2024-03-31 NOTE — Discharge Instructions (Addendum)
 Physical exam findings does not show an infectious process.  Symptoms most likely associated with dry socket/postoperative pain from wisdom tooth extraction on the right.  We will treat with the following: Toradol  injection given today. This is a medication to help with pain. This is not a narcotic.   Hydrocodone /acetaminophen  5/325 mg 1-2 every 6 hours as needed for severe pain. Do not take tylenol  while taking this medication. Ok to take ibuprofen Lidocaine 15 mls swish for 30 seconds and then spit out. Wait 15 minutes before eating or drinking. May do this every 3 hours as needed for pain Call your dentist first thing tomorrow morning. May return to urgent care as needed.

## 2024-06-22 ENCOUNTER — Ambulatory Visit: Payer: Self-pay

## 2024-12-17 ENCOUNTER — Other Ambulatory Visit: Payer: Self-pay

## 2024-12-17 ENCOUNTER — Ambulatory Visit
Admission: EM | Admit: 2024-12-17 | Discharge: 2024-12-17 | Disposition: A | Discharge status: Home / Self Care | Source: Home / Self Care

## 2024-12-17 ENCOUNTER — Encounter: Payer: Self-pay | Admitting: Emergency Medicine

## 2024-12-17 DIAGNOSIS — J069 Acute upper respiratory infection, unspecified: Secondary | ICD-10-CM

## 2024-12-17 LAB — POCT INFLUENZA A/B
Influenza A, POC: NEGATIVE
Influenza B, POC: NEGATIVE

## 2024-12-17 NOTE — ED Triage Notes (Signed)
 Pt here for cough, congestion and sore throat x 3 days; pt sts possible fever 3 days ago that is resolved
